# Patient Record
Sex: Female | Born: 1965 | Race: Black or African American | Hispanic: No | Smoking: Never smoker
Health system: Southern US, Community
[De-identification: ages and names within clinical notes are randomized; demographics above are authoritative.]

## PROBLEM LIST (undated history)

## (undated) DIAGNOSIS — C801 Malignant (primary) neoplasm, unspecified: Secondary | ICD-10-CM

## (undated) DIAGNOSIS — Z85528 Personal history of other malignant neoplasm of kidney: Secondary | ICD-10-CM

## (undated) DIAGNOSIS — R7303 Prediabetes: Secondary | ICD-10-CM

## (undated) DIAGNOSIS — I10 Essential (primary) hypertension: Secondary | ICD-10-CM

## (undated) HISTORY — PX: APPENDECTOMY: SHX54

---

## 2001-07-02 ENCOUNTER — Encounter: Admission: RE | Admit: 2001-07-02 | Discharge: 2001-07-02 | Payer: Self-pay

## 2002-05-06 ENCOUNTER — Encounter (INDEPENDENT_AMBULATORY_CARE_PROVIDER_SITE_OTHER): Payer: Self-pay

## 2002-05-06 ENCOUNTER — Encounter: Payer: Self-pay | Admitting: Emergency Medicine

## 2002-05-06 ENCOUNTER — Inpatient Hospital Stay (HOSPITAL_COMMUNITY): Admission: EM | Admit: 2002-05-06 | Discharge: 2002-05-11 | Payer: Self-pay | Admitting: Emergency Medicine

## 2002-07-04 ENCOUNTER — Ambulatory Visit (HOSPITAL_COMMUNITY): Admission: RE | Admit: 2002-07-04 | Discharge: 2002-07-04 | Payer: Self-pay | Admitting: General Surgery

## 2002-07-04 ENCOUNTER — Encounter: Payer: Self-pay | Admitting: General Surgery

## 2002-07-26 ENCOUNTER — Ambulatory Visit (HOSPITAL_COMMUNITY): Admission: RE | Admit: 2002-07-26 | Discharge: 2002-07-26 | Payer: Self-pay | Admitting: Urology

## 2002-07-26 ENCOUNTER — Encounter (INDEPENDENT_AMBULATORY_CARE_PROVIDER_SITE_OTHER): Payer: Self-pay | Admitting: Specialist

## 2002-07-26 ENCOUNTER — Encounter: Payer: Self-pay | Admitting: Urology

## 2002-08-22 HISTORY — PX: KIDNEY SURGERY: SHX687

## 2002-10-22 ENCOUNTER — Encounter: Admission: RE | Admit: 2002-10-22 | Discharge: 2002-10-22 | Payer: Self-pay | Admitting: Urology

## 2002-10-22 ENCOUNTER — Encounter: Payer: Self-pay | Admitting: Urology

## 2002-11-01 ENCOUNTER — Ambulatory Visit (HOSPITAL_BASED_OUTPATIENT_CLINIC_OR_DEPARTMENT_OTHER): Admission: RE | Admit: 2002-11-01 | Discharge: 2002-11-01 | Payer: Self-pay | Admitting: Urology

## 2002-11-19 ENCOUNTER — Encounter (INDEPENDENT_AMBULATORY_CARE_PROVIDER_SITE_OTHER): Payer: Self-pay | Admitting: Specialist

## 2002-11-19 ENCOUNTER — Ambulatory Visit (HOSPITAL_COMMUNITY): Admission: RE | Admit: 2002-11-19 | Discharge: 2002-11-19 | Payer: Self-pay | Admitting: Urology

## 2002-11-19 ENCOUNTER — Encounter: Payer: Self-pay | Admitting: Urology

## 2002-11-19 ENCOUNTER — Encounter (INDEPENDENT_AMBULATORY_CARE_PROVIDER_SITE_OTHER): Payer: Self-pay

## 2002-12-02 ENCOUNTER — Inpatient Hospital Stay (HOSPITAL_COMMUNITY): Admission: RE | Admit: 2002-12-02 | Discharge: 2002-12-04 | Payer: Self-pay | Admitting: Urology

## 2002-12-02 ENCOUNTER — Encounter (INDEPENDENT_AMBULATORY_CARE_PROVIDER_SITE_OTHER): Payer: Self-pay | Admitting: Specialist

## 2002-12-14 ENCOUNTER — Emergency Department (HOSPITAL_COMMUNITY): Admission: EM | Admit: 2002-12-14 | Discharge: 2002-12-14 | Payer: Self-pay | Admitting: Emergency Medicine

## 2003-01-25 ENCOUNTER — Emergency Department (HOSPITAL_COMMUNITY): Admission: EM | Admit: 2003-01-25 | Discharge: 2003-01-25 | Payer: Self-pay | Admitting: *Deleted

## 2003-06-27 ENCOUNTER — Other Ambulatory Visit: Admission: RE | Admit: 2003-06-27 | Discharge: 2003-06-27 | Payer: Self-pay | Admitting: Internal Medicine

## 2003-12-02 ENCOUNTER — Emergency Department (HOSPITAL_COMMUNITY): Admission: EM | Admit: 2003-12-02 | Discharge: 2003-12-02 | Payer: Self-pay | Admitting: Family Medicine

## 2004-08-09 ENCOUNTER — Observation Stay (HOSPITAL_COMMUNITY): Admission: RE | Admit: 2004-08-09 | Discharge: 2004-08-09 | Payer: Self-pay | Admitting: Gynecology

## 2004-08-09 ENCOUNTER — Encounter (INDEPENDENT_AMBULATORY_CARE_PROVIDER_SITE_OTHER): Payer: Self-pay | Admitting: Specialist

## 2004-08-09 ENCOUNTER — Encounter (INDEPENDENT_AMBULATORY_CARE_PROVIDER_SITE_OTHER): Payer: Self-pay | Admitting: *Deleted

## 2004-11-15 ENCOUNTER — Emergency Department (HOSPITAL_COMMUNITY): Admission: EM | Admit: 2004-11-15 | Discharge: 2004-11-15 | Payer: Self-pay | Admitting: Family Medicine

## 2004-11-28 ENCOUNTER — Emergency Department (HOSPITAL_COMMUNITY): Admission: EM | Admit: 2004-11-28 | Discharge: 2004-11-29 | Payer: Self-pay | Admitting: Emergency Medicine

## 2004-12-09 ENCOUNTER — Ambulatory Visit: Payer: Self-pay | Admitting: Internal Medicine

## 2005-02-11 ENCOUNTER — Ambulatory Visit: Payer: Self-pay | Admitting: Internal Medicine

## 2005-07-19 ENCOUNTER — Other Ambulatory Visit: Admission: RE | Admit: 2005-07-19 | Discharge: 2005-07-19 | Payer: Self-pay | Admitting: Obstetrics and Gynecology

## 2006-10-26 ENCOUNTER — Other Ambulatory Visit: Admission: RE | Admit: 2006-10-26 | Discharge: 2006-10-26 | Payer: Self-pay | Admitting: Obstetrics and Gynecology

## 2006-10-31 ENCOUNTER — Ambulatory Visit (HOSPITAL_COMMUNITY): Admission: RE | Admit: 2006-10-31 | Discharge: 2006-10-31 | Payer: Self-pay | Admitting: Obstetrics and Gynecology

## 2007-08-30 ENCOUNTER — Ambulatory Visit (HOSPITAL_COMMUNITY): Admission: RE | Admit: 2007-08-30 | Discharge: 2007-08-30 | Payer: Self-pay | Admitting: Urology

## 2007-09-24 ENCOUNTER — Ambulatory Visit: Payer: Self-pay | Admitting: Gastroenterology

## 2008-09-09 ENCOUNTER — Ambulatory Visit (HOSPITAL_COMMUNITY): Admission: RE | Admit: 2008-09-09 | Discharge: 2008-09-09 | Payer: Self-pay | Admitting: Urology

## 2009-09-18 ENCOUNTER — Ambulatory Visit (HOSPITAL_COMMUNITY): Admission: RE | Admit: 2009-09-18 | Discharge: 2009-09-18 | Payer: Self-pay | Admitting: Urology

## 2010-11-16 ENCOUNTER — Ambulatory Visit: Payer: Self-pay | Admitting: Family Medicine

## 2011-01-07 NOTE — Discharge Summary (Signed)
NAME:  Patricia Harris, Patricia Harris                           ACCOUNT NO.:  [id number]   MEDICAL RECORD NO.:  [id number]                   PATIENT TYPE:  INP   LOCATION:  0351                                 FACILITY:  [facility]   PHYSICIAN:  Mark C. Vernie Ammons, M.D.               DATE OF BIRTH:  10-15-1965   DATE OF ADMISSION:  12/02/2002  DATE OF DISCHARGE:  12/04/2002                                 DISCHARGE SUMMARY   PRINCIPAL DIAGNOSES:  Left renal cell carcinoma.   OTHER DIAGNOSES:  1. Postoperative anemia.  2. Gastroesophageal reflux disease.   MAJOR OPERATION:  Left radical nephrectomy.  The pathology revealed renal  cell carcinoma 4.5 cm in greatest dimension, grade 3/4, pathologic stage T1  B.   DISPOSITION:  The patient is discharged home in stable, satisfactory, and  improved condition tolerating regular diet, afebrile and her incision is  healing nicely.  She had skin staples in place which will be removed at her  first follow-up in my office in approximately one week.  Her activity will  be limited to no heavy lifting, no driving, minimal up and down stairs.  Her  diet will be unrestricted.   DISCHARGE MEDICATIONS:  1. Tylox one to two q.4h. p.r.n. pain.  2. Iron sulfate 65 mg one p.o. daily.   HISTORY:  The patient is a 45 year old black female who was found on work-up  for right lower quadrant pain to have a left renal mass.  It was  indeterminate in its appearance and was appropriately followed up with  repeat CT scan.  I also performed serial ultrasounds as well as retrograde  pyelogram.  The mass was aspirated, checked for infectious agents and it was  negative and fine needle aspiration cytology was negative for malignant  cells.  A repeat core biopsy, however, revealed clear cell carcinoma.  She  is admitted for left radical nephrectomy.   Past history and physical examination previously dictated, noted in her  hospital chart.   HOSPITAL COURSE:  On December 02, 2002 the  patient was taken to the operating  room where she underwent a left radical nephrectomy without complication or  need for transfusion.  The night of surgery she was noted to be doing well,  clear urine, good urine output and the following day the Foley catheter was  removed.  A postoperative hemoglobin and hematocrit were noted to be  slightly low at 9.1 and 26.4, respectively.  The following day it was  rechecked.  It was 9.5 and 27.4, noted to be stable.  No ecchymoses were  present.   Her renal function remained within the normal range increasing from 0.8 on  admission to 0.2 and then 0.3.  Her diet was advanced from clear liquids to  regular diet.  This was well tolerated.  The patient's incision was noted to  be  healing well without signs of infection  and by her second postoperative day  she was felt ready for discharge once she eats her morning meal.  I  discussed the pathology report with her and will see her in my office in  follow-up in one week for staple removal.                                               Mark C. Vernie Ammons, M.D.    MCO/MEDQ  D:  12/04/2002  T:  12/04/2002  Job:  [id number]   cc:   Adolph Pollack, M.D.  [address]  [place]  [place] [address]  Fax: [phone]

## 2011-01-07 NOTE — Op Note (Signed)
NAME:  Patricia Harris, Patricia Harris                           ACCOUNT NO.:  [id number]   MEDICAL RECORD NO.:  [id number]                   PATIENT TYPE:  AMB   LOCATION:  NESC                                 FACILITY:  [facility]   PHYSICIAN:  Mark C. Vernie Ammons, M.D.               DATE OF BIRTH:  19-Nov-1965   DATE OF PROCEDURE:  11/01/2002  DATE OF DISCHARGE:                                 OPERATIVE REPORT   PREOPERATIVE DIAGNOSES:  Left renal lesion.   POSTOPERATIVE DIAGNOSES:  Left renal lesion.   PROCEDURE:  1. Cystoscopy.  2. Left retrograde pyelography.   SURGEON:  Mark C. Vernie Ammons, M.D.   ASSISTANT:  Crecencio Mc, M.D.   ANESTHESIA:  General.   COMPLICATIONS:  None.   INDICATIONS FOR PROCEDURE:  Ms. Recendiz is an interesting 45 year old African-  American female who was recently evaluated after an abnormality was noted on  her left kidney during a workup for appendicitis. The patient had a CT scan  which demonstrated a 1.5 x 3.5 cm cystic and possibly solid component left  renal mass that did appear to displace the collecting system somewhat. A  renal ultrasound was also performed which demonstrated a lesion consistent  with the CT scan results with cystic and solid components. The patient  subsequently had an aspiration biopsy performed as it was felt that this  lesion had a low suspicion of being malignant initially. It possibly  represented an area of focal nephronia versus renal abscess. The aspiration  did not reveal any significant abnormality or purulence. Of note, the  patient had been having some pain in this area which lead to the thought  process that this may be infectious in nature. It was then decided to have  the patient followup with a CT scan three months later. This demonstrated an  area in the left mid pole of the kidney consistent with an area of abnormal  enhancement. While this distorted the renal contour very little, it did  appear to compress the collecting system of  the kidney. It was therefore  thought that this could represent a possible transitional cell carcinoma.  However, renal cell carcinoma could not be ruled out either. Therefore, it  was decided to perform cystoscopy with retrograde pyelography to identify  any abnormality of the collecting system. The potential risks and benefits  of this procedure were explained to the patient and she consented.   DESCRIPTION OF PROCEDURE:  The patient was taken to the operating room and a  general anesthetic was administered. The patient was given preoperative  antibiotics, placed in the dorsal lithotomy position, prepped and draped in  the usual sterile fashion. Next, cystoscopy was performed with the 12 and 70  degree lens. A complete survey of the bladder revealed no evidence of  bladder tumors, stones, or other mucosal pathology. The ureteral orifices  were in the normal anatomic position and effluxing  clear urine. Attention  then turned to the left ureteral orifice and a retrograde pyelogram was  performed with a 6 French endhole catheter. This revealed a normal left  ureter without evidence of filling defects or dilation. The left renal  pelvis also appeared normal without intracollecting system filling defects.  However, there did appear to be some splaying of the mid to lower pole  collecting system consistent with external compression due to mass effect.  As there did not appear to be any intracollecting system masses, it was  decided not to perform ureteroscopy. Therefore the patient's bladder was  emptied and the procedure was ended. There were no complications and the  patient appeared to tolerate the procedure well. She was able to be  transferred to the recovery unit in satisfactory condition. Please note that  Dr. Ihor Gully was the operating surgeon and was present and participated  in this entire procedure.     Crecencio Mc, M.D.                          Veverly Fells. Vernie Ammons, M.D.     LB/MEDQ  D:  11/01/2002  T:  11/01/2002  Job:  [id number]

## 2011-01-07 NOTE — Op Note (Signed)
NAME:  Patricia Harris, Patricia Harris                 ACCOUNT NO.:  [id number]   MEDICAL RECORD NO.:  [id number]          PATIENT TYPE:  OBV   LOCATION:  9399                          FACILITY:  [facility]   PHYSICIAN:  Juan H. Lily Peer, M.D.DATE OF BIRTH:  05-28-1966   DATE OF PROCEDURE:  DATE OF DISCHARGE:                                 OPERATIVE REPORT   Audio too short to transcribe (less than 5 seconds)     Juan   JHF/MEDQ  D:  08/09/2004  T:  08/09/2004  Job:  [id number]

## 2011-01-07 NOTE — H&P (Signed)
NAME:  Patricia Harris, Patricia Harris                           ACCOUNT NO.:  [id number]   MEDICAL RECORD NO.:  [id number]                   PATIENT TYPE:  EMS   LOCATION:  ED                                   FACILITY:  [facility]   PHYSICIAN:  Adolph Pollack, M.D.            DATE OF BIRTH:  [birth year]   DATE OF ADMISSION:  05/06/2002  DATE OF DISCHARGE:                                HISTORY & PHYSICAL   CHIEF COMPLAINT:  Right lower quadrant pain.   HISTORY OF PRESENT ILLNESS:  The patient is a 45 year old female who began  having some severe right upper quadrant pain, combination of severe cramping  and a sharp pain, along with nausea and vomiting.  She had also had some  vaginal discharge noted.  She reports subjective fever.  She had something  like this about a month ago but it was self-limited.  She has a known right  ovarian cyst and she thought the pain a month ago was that, thought this  pain was that as well.  Her last menstrual period was two weeks ago.  She  was evaluated in the emergency department and CT scan was performed.  This  demonstrated findings consistent with acute appendicitis.  Also noted was an  ill-defined 2.5 cm left renal mass.  It was at this time that I was asked to  see her.   PAST MEDICAL HISTORY:  1. Right ovarian cyst.  2. Gastroesophageal reflux disease.   PREVIOUS OPERATIONS:  None.   ALLERGIES:  No known drug allergies.   MEDICATIONS:  Omeprazole q.d.   SOCIAL HISTORY:  Denies tobacco or alcohol use.  She works for [employer], Geographical information systems officer.   FAMILY HISTORY:  Positive for hypertension in her father.  Her mother died  from colon cancer.   REVIEW OF SYSTEMS:  CARDIOVASCULAR:  She denies hypertension or heart  disease.  PULMONARY:  She denies pneumonia, asthma, tuberculosis.  GI:  She  denies peptic ulcer disease, diverticulitis, or hepatitis.  GU:  She states  she has had urinary tract infection in the distant past.  Again, last  menstrual  period was two weeks ago.  ENDOCRINE:  No diabetes or thyroid  disease.  HEMATOLOGIC:  No bleeding disorders, blood clots, or transfusions.   PHYSICAL EXAMINATION:  GENERAL:  A moderately-obese female, appears to be  ill.  VITAL SIGNS:  Temperature 98.4, blood pressure 107/65, heart rate 79,  respiratory rate 28.  HEENT:  Extraocular movements intact and no icterus is noted.  NECK:  Supple without palpable masses.  HEART:  Demonstrates increased rate with a regular rhythm.  RESPIRATORY:  Breath sounds are equal and clear and respirations are  nonlabored.  ABDOMEN:  Soft.  Hypoactive bowel sounds noted.  There is right lower  quadrant tenderness and guarding to palpation and percussion.  There is a  positive Rovsing's sign.  BACK:  No CVA  tenderness.  EXTREMITIES:  No edema noted.  Full range of motion is noted.  She had  normal station and gait as she walked to and from the bathroom.   LABORATORY DATA:  Hemoglobin 13.1; white blood cell count 24,300 with a  leftward shift.  Urinalysis demonstrates 3-6 white blood cells, rare  bacteria, and 0-2 red blood cells.   CT scan was reviewed and demonstrates findings as above.   IMPRESSION:  1. Acute appendicitis.  2. Ill-defined left renal mass.   PLAN:  Laparoscopic/possible open appendectomy.  Will need short-term follow-  up of the renal mass by way of CT.  I did explain the procedure and the  risks of the appendectomy to her.  The risks include but are not limited to  bleeding; infection; risks of anesthesia; risk of accidental damage to  intraabdominal organs such as the small intestine, bladder, large intestine,  etc.  She seems to understand this and is agreeable to proceeding.                                                 Adolph Pollack, M.D.    Kari Baars  D:  05/06/2002  T:  05/06/2002  Job:  [id number]

## 2011-01-07 NOTE — Consult Note (Signed)
   NAME:  Patricia Harris, Patricia Harris                           ACCOUNT NO.:  [id number]   MEDICAL RECORD NO.:  [id number]                   PATIENT TYPE:  EMS   LOCATION:  ED                                   FACILITY:  [facility]   PHYSICIAN:  Claudette Laws, M.D.               DATE OF BIRTH:  1965-10-01   DATE OF CONSULTATION:  01/25/2003  DATE OF DISCHARGE:                                   CONSULTATION   CHIEF COMPLAINT:  Abdominal pain.   PRESENT ILLNESS:  This 45 year old lady underwent a left nephrectomy by Dr.  Vernie Ammons on about April 12.  She has been doing well and then since Monday  has developed some pain in the medial portion of the incision, no fever or  chills, no nausea or vomiting. She called me earlier in the day and I talked  with her, and then she presented herself to the emergency room where I saw  her. Otherwise, no change in her review of systems.  No known drug  allergies.  She has had an appendectomy in the past.   PHYSICAL EXAMINATION:  In no acute distress.  Abdomen was somewhat obese but  nontender, no rebound. The incision is healing nicely. Some voluntary  guarding with the left upper quadrant.  Her BP was 144/102, pulse 64,  respirations 18, temp 97.4.   IMPRESSION:  1. Abdominal pain, unknown etiology.  Rule out  incisional pain.  2. Status post left radical nephrectomy December 02, 2002, by Dr. Vernie Ammons.   DISCUSSION:  I went over the findings with the patient and basically only  recommended symptomatic treatment for now. I did not feel that a CAT scan  was necessary at this point. I did write a prescription for Tylox #30 p.r.n.  pain and told her to take it easy this weekend and call me if she got any  worse, but she is to follow up with Dr. Vernie Ammons on Monday, January 27, 2003.                                               Claudette Laws, M.D.    RFS/MEDQ  D:  01/25/2003  T:  01/25/2003  Job:  [id number]   cc:   Veverly Fells. Vernie Ammons, M.D.  [address]  [place]  [place] [address]  Fax: [phone]

## 2011-01-07 NOTE — Discharge Summary (Signed)
   NAME:  Patricia Harris, Patricia Harris                           ACCOUNT NO.:  [id number]   MEDICAL RECORD NO.:  [id number]                   PATIENT TYPE:  INP   LOCATION:  0444                                 FACILITY:  [facility]   PHYSICIAN:  Adolph Pollack, M.D.            DATE OF BIRTH:  11-23-1965   DATE OF ADMISSION:  05/06/2002  DATE OF DISCHARGE:  05/11/2002                                 DISCHARGE SUMMARY   PRINCIPAL DISCHARGE DIAGNOSIS:  Perforated appendicitis.   SECONDARY DIAGNOSIS:  Gastroesophageal reflux disease.   PROCEDURE:  Laparoscopic appendectomy.   REASON FOR ADMISSION:  This is a 45 year old female with abdominal pain, who  came to the emergency room and was evaluated by the emergency room  physician. CT scan demonstrated findings consistent with acute appendicitis.  Also noted on the CT scan was an ill defined 2.5 cm central left renal mass.  I saw her and subsequently she was admitted and taken to the OR.   HOSPITAL COURSE:  She underwent the above procedure without complications.  She is noted to have a perforation. The abdominal cavity was copiously  irrigated intraoperatively. Postoperatively, she is kept on the IV  antibiotics. She had a problem with an IV one day and it was switched over  to Augmentin but she continued to run fevers and I changed her back over to  Cefotetan. She began having some bowel activity on the third and fourth  postoperative days. By her fifth postoperative day here, she was  defervescing. White blood cell count was declining. She felt much better  overall. Bowel function had returned. She was tolerating a diet and was able  to be discharged.   DISPOSITION:  Discharged to home in satisfactory condition on May 11, 2002.   DISCHARGE MEDICATIONS:  1. She is discharged on Ciprofloxacin 750 mg every twelve hours.  2. Flagyl 500 mg every four hours.  3. Vicodin as needed for pain.   FOLLOW UP:  She will come back and see me in two  weeks. She knows to call as  soon as she has problems.                                                  Adolph Pollack, M.D.    Kari Baars  D:  05/21/2002  T:  05/21/2002  Job:  [id number]

## 2011-01-07 NOTE — Op Note (Signed)
NAME:  Patricia Harris, Patricia Harris                           ACCOUNT NO.:  [id number]   MEDICAL RECORD NO.:  [id number]                   PATIENT TYPE:  INP   LOCATION:  X002                                 FACILITY:  [facility]   PHYSICIAN:  Mark C. Vernie Ammons, M.D.               DATE OF BIRTH:  1966/03/30   DATE OF PROCEDURE:  12/02/2002  DATE OF DISCHARGE:                                 OPERATIVE REPORT   PREOPERATIVE DIAGNOSES:  Left renal cell carcinoma.   POSTOPERATIVE DIAGNOSES:  Left renal cell carcinoma.   PROCEDURE:  Left radical nephrectomy.   SURGEON:  Mark C. Vernie Ammons, M.D.   ASSISTANT:  Lindaann Slough, M.D.   ANESTHESIA:  General endotracheal.   DRAINS:  16 French Foley catheter.   ESTIMATED BLOOD LOSS:  Approximately 300 mL.   SPECIMENS:  Left kidney to pathology.   COMPLICATIONS:  None.   INDICATIONS FOR PROCEDURE:  The patient is a 45 year old black female who  was incidentally found to have a left renal mass by CT scan and atypical  appearance in the serial scans were obtained and eventually her workup  revealed clear cell carcinoma of the left kidney by core biopsy. She was  brought to the OR today for a left radical nephrectomy.   DESCRIPTION OF PROCEDURE:  After informed consent, the patient was brought  to the major OR, placed on the table, administered general anesthesia. A  Foley catheter was inserted and she was moved to the flank position, secured  to the table and  the left flank was then sterilely prepped and draped. A  skin incision was then made subcostally from the tip of the twelfth rib,  carried down through the external and internal oblique fascia and muscles as  well as transversalis. I then entered the retroperitoneal space and  identified the ureter with a combination of blunt and sharp dissection. This  was clipped and divided. The gonadal vessels were also identified, clipped  and divided. I dissected Gerota's fascia off of the peritoneum medially  and  identified the renal vein and artery. I dissected posteriorly behind the  kidney and was able to lift the kidney up and expose a single renal artery  which was doubly tied with #0 silk suture proximally and singly tied  distally and cut and divided. With the arterial supply controlled, I then  isolated, tied, ligated and divided the adrenal vein as well as the gonadal  vein next to the renal vein. Interestingly two gonadal veins were  identified, both were ligated and divided. I then tied the renal vein with  two #1 silk sutures proximally, one distally and divided it. The remaining  attachments superiorly were isolated with right angled clamp, clipped and  divided. A portion of the adrenal gland was removed. The bulk of the adrenal  gland was left in situ. The tail of the pancreas was also identified and  dissected off of Gerota's fascia. No injury to the tail of the pancreas  occurred. I then irrigated the wound, checked for bleeding points. These  were cauterized and I then placed Gelfoam in the bed of the adrenal gland  the most superior portion of the renal fossa. No further bleeding was  identified and therefore, lowered the kidney rest and closed the wound in  three layers, first closing the transversalis and the internal oblique and  external oblique all with running #1 PDS suture. The wound was copiously  irrigated with saline and 0.5% plain Marcaine was then used to anesthetize  the skin and subcu region after which the skin was closed with skin staples  and a sterile occlusive dressing was applied. The patient was then awakened  and taken to the recovery room in stable satisfactory condition. She  tolerated the procedure well with no intraoperative complications. Sponge,  needle and instrument counts were reportedly correct x2 at the end of the  operation.                                                Mark C. Vernie Ammons, M.D.    MCO/MEDQ  D:  12/02/2002  T:  12/02/2002   Job:  [id number]

## 2011-01-07 NOTE — Op Note (Signed)
NAME:  Patricia Harris, Patricia Harris                           ACCOUNT NO.:  [id number]   MEDICAL RECORD NO.:  [id number]                   PATIENT TYPE:  INP   LOCATION:  0444                                 FACILITY:  [facility]   PHYSICIAN:  Adolph Pollack, M.D.            DATE OF BIRTH:  1966-06-02   DATE OF PROCEDURE:  05/06/2002  DATE OF DISCHARGE:                                 OPERATIVE REPORT   PREOPERATIVE DIAGNOSIS:  Acute appendicitis.   POSTOPERATIVE DIAGNOSIS:  Perforated appendicitis.   PROCEDURE:  Laparoscopic appendectomy.   SURGEON:  Adolph Pollack, M.D.   ANESTHESIA:  General.   INDICATIONS:  The patient is a 45 year old female who had persistent right  lower quadrant pain with nausea and vomiting.  She presented to the  emergency department where she was evaluated and noted to have a white blood  cell count with 24,300.  CT scan was consistent with appendicitis, and she  also had an ill-defined renal mass.  I was subsequently asked to see her,  and now she is brought to the operating room.  The procedure and risks were  explained to her preoperatively.   TECHNIQUE:  She is placed supine on the operating room table and a general  anesthetic was administered.  A Foley catheter was placed in the bladder.  Her abdomen was sterilely prepped and draped.  A small subumbilical incision  was made through the skin and subcutaneous tissue, down to the level of the  midline fascia.  A small incision was made in the midline fascia.  The  peritoneal cavity was entered bluntly and under direct vision.  A  pursestring suture of 0 Vicryl was placed around the fascial _____________.  An Hasson trocar was introduced through the peritoneal cavity, and  pneumoperitoneum created by insufflation of CO2 gas.   The laparoscope was introduced and I saw purulent fluid along the right  gutter in the pelvis, consistent with appendiceal perforation.  A 5 mm  trocar was then placed in the left  lower quadrant region and one also in the  right upper quadrant region.  The patient was placed in the Trendelenburg  position and rotated partially left-side down.  I evacuated the purulent  fluid.  I then identified the appendix, which was acutely inflamed at its  tip, with some fibrinous debris over the tip -- which may have well been the  point of perforation.  I divided the mesoappendix with the harmonic scalpel  down to the base of the appendix as it joined the cecum.  I then amputated  the appendix off the cecum with the Endo GIA stapler.  The staple line was  flush with the cecum and hemostatic.  The appendix was placed in an Endo  pouch bag and then removed through the subumbilical incision.  The Hasson  trocar was then placed back into the abdominal cavity.   I then  copiously irrigated out the abdominal cavity with 2 L of saline  solution.  I noted the visceral peritoneum and some of the small intestine  around the uterus, and on the right to be inflamed.  I did note a small  right ovarian cyst.   Once I evacuated as much of the fluid as possible, I removed the left lower  quadrant trocar and then removed the subumbilical trocar.  I closed the  fascial defect under direct laparoscopic vision by tightening up and tying  down the pursestring suture.  I then released the pneumoperitoneum by way of  the right upper quadrant trocar and then removed it.  The skin incisions  were closed with 4-0 Monocryl subcuticular stitches.  Steri-Strips and  sterile dressings were applied.   She tolerated the procedure well without any apparent complications.  She  was taken to the recovery room in satisfactory condition.  Because of the  perforation, she will require postoperative IV antibiotics and may well  develop significant postoperative ileus.  Unfortunately, the risks of  recurrent intraabdominal abscess following the procedure is a bit higher  too.                                                Adolph Pollack, M.D.    Kari Baars  D:  05/06/2002  T:  05/06/2002  Job:  [id number]

## 2011-01-07 NOTE — H&P (Signed)
NAME:  Patricia Harris, Patricia Harris                           ACCOUNT NO.:  [id number]   MEDICAL RECORD NO.:  [id number]                   PATIENT TYPE:  INP   LOCATION:  X002                                 FACILITY:  [facility]   PHYSICIAN:  Mark C. Vernie Ammons, M.D.               DATE OF BIRTH:  03/14/1966   DATE OF ADMISSION:  12/02/2002  DATE OF DISCHARGE:                                HISTORY & PHYSICAL   HISTORY OF PRESENT ILLNESS:  The patient is a 45 year old black female who I  initially saw in November of last year. She had had a recent CT scan for  abdominal pain and was found to have appendicitis, for which she underwent  laparoscopic appendectomy. The CT scan which was obtained as part of that  workup revealed an ill-defined, 2.5-cm left renal mass. She had no history  of pyelonephritis or other voiding symptoms.   A repeat CT scan was obtained to determine whether the area demonstrated  evidence of resolution, and it appeared somewhat more cystic on a followup  CT scan. It increased slightly in size. A renal ultrasound that day revealed  no significant increase in blood flow in this area, and it was my feeling  that this could possibly represent an abscess, although she was minimally  symptomatic. The question of whether it could be tuberculosis related or  some other more indolent process was entertained.   She was scheduled for aspiration of this, and at the time under  ultrasound  the area was very difficult to visualize by the radiologist. He felt it  looked like a focal nephronium but was able to aspirate some material. That  material was cultured, but no organisms grew. The cytology was negative.   I repeated her ultrasound in December and noted the area appeared to be less  prominent, measuring somewhat smaller and appeared to have less fluid or  cyst-like component and be fairly sharply demarcated. It was again  entertained infectious versus congenital or acquired lesion such as  a  calyceal diverticulum. My feeling was that malignancy was possible but low  probability.   I followed it up with a repeat CT scan and then it appeared slightly more  solid. Because of that change, she underwent repeat core biopsy of the  lesion. 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 There was no adenopathy. Her chest x-ray was clear. She is  admitted at this time for elective left radical nephrectomy.   PAST MEDICAL HISTORY:  1. Right ovarian cyst in January 2002. 2  2. Gastroesophageal reflux disease.   PAST SURGICAL HISTORY:  Laparoscopic appendectomy.   ALLERGIES:  No known drug allergies.   SOCIAL HISTORY:  She denies tobacco or ethanol use. She works for [employer].   FAMILY HISTORY:  Positive for hypertension in her father; he is 52.  Her  mother died of  colon cancer at 53. There is no diabetes or kidney stones in  the family.   REVIEW OF SYSTEMS:  Negative for any cardiac or pulmonary complaint. She  does have some occasional reflux symptoms. No urologic complaints are noted.  No bleeding disorders.   PHYSICAL EXAMINATION:  VITAL SIGNS:  Stable per health history.  GENERAL:  She is a slightly obese black female in no apparent distress.  HEENT:  Normocephalic, atraumatic. Oropharynx clear.  NECK:  Supple without mass, JVD or adenopathy.  CARDIOVASCULAR:  Regular rate and rhythm.  CHEST:  Clear to auscultation with normal respiratory effort.  ABDOMEN:  Soft, nontender without masses or hepatosplenomegaly. No CVA  tenderness. No peritoneal signs are noted.  GU:  Normal external female genitalia with a normally placed urethral  meatus. No periurethral lesions, mass or tenderness. There is no bladder  base abnormality. Cervix is palpably normal. No vaginal  discharge or  urethral discharge.  EXTREMITIES:  No cyanosis, clubbing or edema. Full range of motion.  NEUROLOGIC:  No gross focal neurologic  deficits.   IMPRESSION:  Left renal cell carcinoma. The lesion is small but appears to  be contained within the kidney. There does not appear to be any adenopathy  and no abnormality on chest x-ray or other signs or symptoms that would  suggest metastatic disease. We therefore have discussed left radical  nephrectomy as the best treatment option. Because of the location of the  lesion, there is no possibility of performing a partial nephrectomy. She has  undergone cystoscopy and retrograde pyelogram on the left hand side and the  collecting system appears entirely normal with no evidence of infiltration.  She therefore is admitted for elective left radical nephrectomy.   I have discussed the risks, complications and alternatives with her  including but not limited to the bleeding, infection, anesthetic risks, DVT,  PE, MI, pneumothorax, injury to surrounding organs such as the spleen  requiring their repair  and/or removal. The patient understands this and has  elected to proceed with surgical therapy.   PLAN:  1. Left radical nephrectomy.  2. Perioperative antibiotics.  3. Routine DVT prophylaxis with PAS and TED hose.                                               Mark C. Vernie Ammons, M.D.    MCO/MEDQ  D:  12/02/2002  T:  12/02/2002  Job:  [id number]

## 2011-01-07 NOTE — Op Note (Signed)
NAME:  Patricia Harris, Patricia Harris                 ACCOUNT NO.:  [id number]   MEDICAL RECORD NO.:  [id number]          PATIENT TYPE:  OBV   LOCATION:  9399                          FACILITY:  [facility]   PHYSICIAN:  Juan H. Lily Peer, M.D.DATE OF BIRTH:  [birth year]   DATE OF PROCEDURE:  08/09/2004  DATE OF DISCHARGE:                                 OPERATIVE REPORT   PREOPERATIVE DIAGNOSES:  1.  Left adnexal mass.  2.  Chronic pelvic pain.  3.  Request for elective permanent sterilization.   POSTOPERATIVE DIAGNOSES:  1.  Left adnexal mass.  2.  Chronic pelvic pain.  3.  Request for elective permanent sterilization.   ANESTHESIA:  General endotracheal anesthesia.   SURGEONS:  Juan H. Lily Peer, M.D., and Nadyne Coombes. Fontaine, M.D.   PROCEDURES PERFORMED:  1.  Laparoscopic left salpingo-oophorectomy.  2.  Right tubal sterilization procedure consisting of cauterization and      transection of the right fallopian tube.   INDICATION FOR OPERATION:  A 45 year old gravida 3, para 2, AB 1, with  chronic left lower quadrant pain, multicystic left adnexal mass, and also  requesting elective permanent sterilization.   FINDINGS:  Normal, smooth-appearing uterus.  Normal right tube with some  peritubal adhesions.  Right normal ovary.  Left normal tube.  Left ovary  approximately 4 cm in size, smooth surface with no excrescences or any  evidence of adhesions.  No endometriotic implants or on the anterior cul-de-  sac or posterior cul-de-sac.   DESCRIPTION OF OPERATION:  After the patient was adequately counseled, she  was taken to the operating room, where she underwent a successful general  endotracheal anesthesia.  She had PSA stockings for DVT prophylaxis and  received 1 g of Ancef for prophylaxis as well.  The abdomen was prepped and  draped in the usual sterile fashion.  Bimanual examination demonstrated a  slightly anteverted uterus and no palpable adnexal masses.  The Hulka  tenaculum was placed for  manipulation during laparoscopic procedure and a  Foley catheter was placed in an effort to monitor urinary output and keep  the bladder decompressed.  After the abdomen was prepped and draped in the  usual sterile fashion, a small stab incision was made underneath the  umbilicus, followed by insertion of a Veress needle.  Opening intra-  abdominal pressure was 9 mmHg.  Approximately 2 L of carbon dioxide was  insufflated to the peritoneal cavity. The Veress needle was removed.  The 10  mm trocar was inserted into the peritoneal cavity.  Two additional ports  were made approximately four fingerbreadths from the midline.  On the right  in the patient's right paramedian region, a 5 mm trocar was inserted and on  the left paramedian side, a 10 mm trocar was inserted, both under  laparoscopic guidance.  After the systematic evaluation of the pelvic cavity  with the above-mentioned findings, attention was placed to the right  fallopian tube.  Some of the peritubular adhesions were transected.  The  right fallopian tube was placed under traction with the self-retaining  retractor and a 2  cm segment of fallopian tube was cauterized and then  transected for the sterilization part of the procedure.  Attention was then  placed to the left adnexal region.  The ovary was 4 cm in size and appeared  to be multiloculated, with smooth surfaces without any excrescences.  It was  mobile.  The left ureter was identified.  The ovary and tube were placed  under traction and the left infundibulopelvic ligament was cauterized and  transected with the tripolar cauterizing cutting unit.  This was undertaken  all the way to the level of the utero-ovarian ligament and both tube and  ovary were completely excised.  The cyst was aspirated to help decompress  the cyst.  The fluid was sent for cytological evaluation and an Endopouch  was inserted through the left paramedian sleeve and the left tube and ovary  were  retrieved through the laparoscopic pouch and submitted for histologic  evaluation.  Pictures were obtained before and after the procedure.  The  pneumoperitoneum was evacuated and the incisions were closed in the  following fashion:  At the left paramedian 10 mm trocar site, the fascia was  closed with a running locking stitch of 0 Vicryl suture; the skin was  reapproximated with a running subcuticular stitch of 4-0 plain catgut  sutures and Steri-Strips were placed over the incision.  The 5 mm trocar  site was closed with two interrupted sutures of 4-0 plain catgut suture.  The subumbilical incision, due to the patient's panniculus, the surgeon's  finger palpated a very, very small fascial defect, which is not  reapproximated because under tension completely closed.  A subcuticular  stitch of 4-0 plain catgut suture was utilized and steri-stripped as well.  For postoperative analgesia, 0.25% Marcaine for a total of 9 mL was  infiltrated in all three port sites.  The Hulka tenaculum was removed.  The  Foley catheter was removed.  The patient was awakened and transferred to the  recovery room with stable vital signs.  Blood loss was minimal.  IV fluids  1900 mL of lactated Ringer's, urine output of 100 mL and clear.     Juan   JHF/MEDQ  D:  08/09/2004  T:  08/10/2004  Job:  [id number]

## 2011-01-07 NOTE — H&P (Signed)
NAME:  Patricia Harris, Patricia Harris                 ACCOUNT NO.:  [id number]   MEDICAL RECORD NO.:  [id number]          PATIENT TYPE:  OBV   LOCATION:  NA                            FACILITY:  [facility]   PHYSICIAN:  Juan H. Lily Peer, M.D.DATE OF BIRTH:  Sep 12, 1965   DATE OF ADMISSION:  DATE OF DISCHARGE:                                HISTORY & PHYSICAL   CHIEF COMPLAINT:  1.  Left lower quadrant pain.  2.  Left pelvic mass.  3.  Request for elective permanent sterilization.   HISTORY OF PRESENT ILLNESS:  The patient is a 45 year old, gravida 3, para  2, AB 1, who was seen in the office as a new patient on June 23, 2004  with a complaint of ongoing left lower quadrant pain.  The patient had been  seeing another gynecologist in the community and came to our office today  because of the ongoing worsening of left lower quadrant pain.  She has also  been under the care of Dr. Loraine Leriche C. Ottelin urologist here in [place] who  back in 2004 had done a left nephrectomy as a result of a renal carcinoma.  She has also had an appendectomy in 2003.  The patient stated her last  mammogram was at baseline at the age of 43 which was normal.  She is using  Ortho-Evra transdermal patch for contraception.  Her cycles have been  reported to be regular otherwise.  Her last Pap smear was normal in  September 2005 along with complete gynecological examination by another  practitioner which was reported to be normal.   The patient had stated that in the past her pain in left lower quadrant have  been slight before and after her periods but now it is all the time.  She is  not sexually active.  She has had three pregnancies-two children delivered  vaginally and one miscarriage.   On examination, there was a tenderness and fullness noted on the left  adnexal region.  I had spoken to Dr. Loraine Leriche C. Ottelin on the phone and he did  not see any contraindication to proceeding with a laparoscopic approach  since a nephrectomy  had been in a retroperitoneal procedure.  He had done an  ultrasound on her in the office when she saw him on June 17, 2004 because  of lower abdominal pain.  Since he had done the nephrectomy, she thought it  might have been urologically related.  The transvaginal ultrasound  demonstrated two nabothian cysts, small in size.  The uterus measures 7.8 by  4.8 by 5.5 cm.  Endometrial thickness was 6.3 mm.  The right ovary had a  small amount of fluid adjacent to it and the ovary measured 3 by 2.1 by 2.4  cm.  The left ovary measures 6 by 4.7 by 5.6 cm and was associated with two  ovarian cyst measuring 4.9 by 4.6 by 5.1 cm and the second measured 2.5 by  1.7 by 3.8 cm both appeared to be simple cysts.  The patient also would like  to proceed with a laparoscopic approach to  remove the left tube and ovary  and sterilization of the contralateral remaining tube since she does not  want to have any more children.   ALLERGIES:  The patient denies any allergies.   FAMILY HISTORY:  Grandmother with diabetes.  Mother with colon cancer.  The  patient's grandmother with breast cancer.  She has had a left nephrectomy in  2004 secondary to renal carcinoma.  She has also had an appendectomy in the  past.  She has been on Ortho-Evra transdermal patch.   PHYSICAL EXAMINATION:  VITAL SIGNS:  The patient weighs 221 pounds.  She is  5 feet 3-3/4 inches tall.  Blood pressure 108/60.  HEENT:  Unremarkable.  NECK:  Supple.  Trachea midline.  No carotid bruits.  No thyromegaly.  LUNGS:  Clear to auscultation without rhonchi or wheezes.  HEART:  Regular rate and rhythm.  No murmurs or gallops.  BREASTS:  Not done.  ABDOMEN:  Soft and nontender.  There is no rebound or guarding.  PELVIC:  There is fullness and tenderness in the left lower quadrant.  Uterus anteverted.  Normal size, shape, and consistency.  Right adnexa.  No  masses or tenderness.  RECTAL:  Deferred.   ASSESSMENT:  A 45 year old, gravida 3,  para 2, abortion 1, with chronic left  lower quadrant pain.  Recent ultrasound demonstrating multicystic left  ovary, two cyst-one measuring 4.9 by 4.6 by 5.1 cm and the second one  measuring 2.5 by 1.7 by 3.8 cm.  Both appear to be simple cysts.  The  patient with prior history of renal carcinoma resulting in left nephrectomy  back in 2004.  [name] and I had a lengthy discussion once again and a  preoperative consult that the appearance of this cyst did not appear to be  malignant but they could not be ruled out until we had a histologic  evaluation.  Because of her worsening left lower quadrant pain, we will  proceed with a diagnostic laparoscopy, left salpingo-oophorectomy.  She  would like to have her contralateral tube transected for sterilization so  that she will not have to continue on the Ortho-Evra transdermal patch since  childbearing is no longer an issue.  We will do pelvic washings and  depending on the histology at that point when we are through we will decide  on definitive course of action in the event that there is any evidence of  malignancy.  We discussed potential risks from laparoscopic surgery to  include trauma to internal organs requiring open laparotomy, also risk for  infection, although she will receive prophylaxis antibiotics.  In the event  of hemorrhage and she were to need blood transfusion, the risks of  transfusion were discussed to include anaphylactic reaction, hepatitis and  acquired immunodeficiency syndrome.  We also discussed the potential for  deep vein thrombosis and pulmonary embolus and she will have PSA stockings  as well as receive prophylaxis antibiotics.  In the event that technical  difficulty are encountered and the operation not able to be completed  laparoscopically, we will need to proceed with an open laparotomy to  complete the operation from that approach.  She was provided with literature information from the [employer] of  Obstetrics and Gynecology on  laparoscopic surgery.  All questions were answered and we will follow  accordingly.   PLAN:  The patient is scheduled for laparoscopic left salpingo-oophorectomy  and right tubal sterilization procedure on Monday, August 09, 2004 at 1  p.m. at  [facility].  Please have history and physical available.     Juan   JHF/MEDQ  D:  08/08/2004  T:  08/08/2004  Job:  [id number]

## 2011-01-07 NOTE — Op Note (Signed)
NAME:  Patricia Harris, Patricia Harris                 ACCOUNT NO.:  [id number]   MEDICAL RECORD NO.:  [id number]          PATIENT TYPE:  OBV   LOCATION:  9399                          FACILITY:  [facility]   PHYSICIAN:  Juan H. Lily Peer, M.D.DATE OF BIRTH:  30-Dec-1965   DATE OF PROCEDURE:  DATE OF DISCHARGE:                                 OPERATIVE REPORT   Audio too short to transcribe (less than 5 seconds)     Juan   JHF/MEDQ  D:  08/09/2004  T:  08/09/2004  Job:  [id number]

## 2012-09-19 ENCOUNTER — Emergency Department (HOSPITAL_COMMUNITY): Payer: BC Managed Care – PPO

## 2012-09-19 ENCOUNTER — Observation Stay (HOSPITAL_COMMUNITY)
Admission: EM | Admit: 2012-09-19 | Discharge: 2012-09-19 | Disposition: A | Discharge status: Home / Self Care | Payer: BC Managed Care – PPO | Attending: Emergency Medicine | Admitting: Emergency Medicine

## 2012-09-19 ENCOUNTER — Encounter (HOSPITAL_COMMUNITY): Payer: Self-pay

## 2012-09-19 DIAGNOSIS — R072 Precordial pain: Secondary | ICD-10-CM

## 2012-09-19 DIAGNOSIS — R0989 Other specified symptoms and signs involving the circulatory and respiratory systems: Secondary | ICD-10-CM | POA: Insufficient documentation

## 2012-09-19 DIAGNOSIS — R079 Chest pain, unspecified: Principal | ICD-10-CM | POA: Insufficient documentation

## 2012-09-19 DIAGNOSIS — R0609 Other forms of dyspnea: Secondary | ICD-10-CM | POA: Insufficient documentation

## 2012-09-19 DIAGNOSIS — Z85528 Personal history of other malignant neoplasm of kidney: Secondary | ICD-10-CM | POA: Insufficient documentation

## 2012-09-19 HISTORY — DX: Personal history of other malignant neoplasm of kidney: Z85.528

## 2012-09-19 LAB — POCT I-STAT TROPONIN I
Troponin i, poc: 0 ng/mL (ref 0.00–0.08)
Troponin i, poc: 0 ng/mL (ref 0.00–0.08)

## 2012-09-19 LAB — HEPATIC FUNCTION PANEL
AST: 10 U/L (ref 0–37)
Albumin: 3.2 g/dL — ABNORMAL LOW (ref 3.5–5.2)
Bilirubin, Direct: <0.1 mg/dL (ref 0.0–0.3)
Total Protein: 7.4 g/dL (ref 6.0–8.3)

## 2012-09-19 LAB — LIPASE, BLOOD: Lipase: 29 U/L (ref 11–59)

## 2012-09-19 LAB — BASIC METABOLIC PANEL
BUN: 16 mg/dL (ref 6–23)
CO2: 23 mEq/L (ref 19–32)
Chloride: 102 mEq/L (ref 96–112)
Creatinine, Ser: 0.8 mg/dL (ref 0.50–1.10)
Glucose, Bld: 109 mg/dL — ABNORMAL HIGH (ref 70–99)

## 2012-09-19 LAB — CBC
HCT: 33.7 % — ABNORMAL LOW (ref 36.0–46.0)
Hemoglobin: 11.4 g/dL — ABNORMAL LOW (ref 12.0–15.0)
MCV: 91.1 fL (ref 78.0–100.0)
RBC: 3.7 MIL/uL — ABNORMAL LOW (ref 3.87–5.11)
WBC: 14.5 10*3/uL — ABNORMAL HIGH (ref 4.0–10.5)

## 2012-09-19 MED ORDER — NITROGLYCERIN 0.4 MG SL SUBL
0.4000 mg | SUBLINGUAL_TABLET | SUBLINGUAL | Status: DC | PRN
  Administered 2012-09-19: 0.4 mg via SUBLINGUAL
  Filled 2012-09-19: qty 25

## 2012-09-19 MED ORDER — ONDANSETRON HCL 4 MG/2ML IJ SOLN
4.0000 mg | Freq: Once | INTRAMUSCULAR | Status: AC
  Administered 2012-09-19: 4 mg via INTRAVENOUS
  Filled 2012-09-19: qty 2

## 2012-09-19 MED ORDER — MORPHINE SULFATE 4 MG/ML IJ SOLN
4.0000 mg | Freq: Once | INTRAMUSCULAR | Status: AC
  Administered 2012-09-19: 4 mg via INTRAVENOUS
  Filled 2012-09-19: qty 1

## 2012-09-19 MED ORDER — SODIUM CHLORIDE 0.9 % IV SOLN: 1000.0000 mL | INTRAVENOUS | Status: DC

## 2012-09-19 MED ORDER — ASPIRIN 81 MG PO CHEW
324.0000 mg | CHEWABLE_TABLET | Freq: Once | ORAL | Status: AC
  Administered 2012-09-19: 324 mg via ORAL
  Filled 2012-09-19: qty 4

## 2012-09-19 NOTE — ED Provider Notes (Signed)
History     CSN: [id number]  Arrival date & time 09/19/12  0100   None     Chief Complaint  Patient presents with  . Chest Pain    (Consider location/radiation/quality/duration/timing/severity/associated sxs/prior treatment) HPI Comments: Patricia Harris is a 47 y.o. female w a hx of appendicitis and kidney CA presents to the ER c/o chest pain. CP started at 3 pm this afternoon and was mild lasting minutes. Pt states that she wrote it off as indigestions, taking Rolaids without relief. But as the day continued CP worsened becoming more af a "pressure like sensation" lasting longer. Pain is worsened by exertion and associated with DOE. Pain is substernal and does not radiate. Current pain is 6/10 in severity & has been constant since 10 pm.  Pt denies positional changes, PND, orthopnea, recent illness, nausea, leg swelling, cough, hemoptysis, fevers, NS or chills. She is a non-smoker and has no fam hx of cardiac issues.   Patient is a 47 y.o. female presenting with chest pain. The history is provided by the patient.  Chest Pain Primary symptoms include shortness of breath (more DOE). Pertinent negatives for primary symptoms include no fever, no fatigue, no cough, no wheezing, no palpitations, no abdominal pain, no nausea, no vomiting and no dizziness.  Pertinent negatives for associated symptoms include no diaphoresis.     Past Medical History  Diagnosis Date  . History of kidney cancer     LEFT    Past Surgical History  Procedure Date  . Appendectomy   . Kidney surgery 2004    LEFT KIDNEY REMOVED     No family history on file.  History  Substance Use Topics  . Smoking status: Never Smoker   . Smokeless tobacco: Never Used  . Alcohol Use: No    OB History    Grav Para Term Preterm Abortions TAB SAB Ect Mult Living                  Review of Systems  Constitutional: Negative for fever, chills, diaphoresis, activity change, fatigue and unexpected weight change.  HENT:  Negative for congestion, neck pain and neck stiffness.   Eyes: Negative for visual disturbance.  Respiratory: Positive for chest tightness and shortness of breath (more DOE). Negative for apnea, cough, wheezing and stridor.   Cardiovascular: Positive for chest pain. Negative for palpitations and leg swelling.  Gastrointestinal: Negative for nausea, vomiting, abdominal pain, diarrhea and blood in stool.  Genitourinary: Negative for dysuria, urgency, hematuria and flank pain.  Musculoskeletal: Negative for myalgias, back pain and gait problem.  Skin: Negative for color change, pallor and wound.  Neurological: Negative for dizziness, syncope, light-headedness and headaches.  All other systems reviewed and are negative.    Allergies  Review of patient's allergies indicates no known allergies.  Home Medications  No current outpatient prescriptions on file.  BP 136/88  Temp 97.9 F (36.6 C) (Oral)  Resp 24  SpO2 99%  LMP 08/22/2012  Physical Exam  Nursing note and vitals reviewed. Constitutional: She appears well-developed and well-nourished. No distress.  HENT:  Head: Normocephalic and atraumatic.  Eyes: Conjunctivae normal and EOM are normal. Pupils are equal, round, and reactive to light.  Neck: Normal range of motion. Neck supple. Normal carotid pulses and no JVD present. Carotid bruit is not present. No rigidity. Normal range of motion present.  Cardiovascular: Normal rate, regular rhythm, S1 normal, S2 normal, normal heart sounds, intact distal pulses and normal pulses.  Exam reveals no  gallop and no friction rub.   No murmur heard.      No pitting edema bilaterally, RRR, no aberrant sounds on auscultations, distal pulses intact, no carotid bruit or JVD.   Pulmonary/Chest: Effort normal. No accessory muscle usage or stridor. No respiratory distress. She exhibits no bony tenderness.       Chest wall ttp sternally, LCAB  Abdominal: Bowel sounds are normal.       Obese abdomen.  Soft, mild epigastric ttp. Non pulsatile aorta.   Skin: Skin is warm, dry and intact. No rash noted. She is not diaphoretic. No cyanosis. Nails show no clubbing.    ED Course  Procedures (including critical care time)  Labs Reviewed  CBC - Abnormal; Notable for the following:    WBC 14.5 (*)     RBC 3.70 (*)     Hemoglobin 11.4 (*)     HCT 33.7 (*)     All other components within normal limits  BASIC METABOLIC PANEL - Abnormal; Notable for the following:    Glucose, Bld 109 (*)     GFR calc non Af Amer 88 (*)     All other components within normal limits  HEPATIC FUNCTION PANEL - Abnormal; Notable for the following:    Albumin 3.2 (*)     All other components within normal limits  LIPASE, BLOOD   US Abdomen Complete  09/19/2012  *RADIOLOGY REPORT*  Clinical Data:  Epigastric pain  COMPLETE ABDOMINAL ULTRASOUND  Comparison:  09/18/2009 CT  Findings:  Gallbladder:  No gallstones, gallbladder wall thickening, or pericholecystic fluid.  Common bile duct:  Within normal limits, measuring 4.5 mm in diameter.  Liver:  Enlarged and increased in echogenicity.  Focal lesion detection is limited in the setting.  IVC:  Appears normal.  Pancreas:  No focal abnormality identified within the head/neck. Poorly visualized body and tail due to limited acoustic windows/bowel gas artifact.  Spleen:  Within normal limits, measuring 7.2 cm.  Right Kidney:  Prominent at 14.2 cm, likely compensatory enlargement.  No hydronephrosis or focal abnormality.  Left Kidney:  Absent.  Abdominal aorta:  No aneurysm identified.  The distal aorta is obscured.  IMPRESSION: Heterogeneous/increased hepatic echogenicity can be seen in the setting of steatosis or hepatitis.  Correlate with LFTs.  Status post left nephrectomy.  Limited visualization of the pancreas.   Original Report Authenticated By: Jearld Lesch, M.D.    Dg Chest Port 1 View  09/19/2012  *RADIOLOGY REPORT*  Clinical Data: Chest pain  PORTABLE CHEST - 1 VIEW   Comparison: 09/18/2009  Findings: Lungs are clear. No pleural effusion or pneumothorax. The cardiomediastinal contours are within normal limits. The visualized bones and soft tissues are without significant appreciable abnormality. Left upper quadrant surgical clip.  IMPRESSION: No acute cardiopulmonary process.   Original Report Authenticated By: Jearld Lesch, M.D.     Date: 09/19/2012  Rate: 70  Rhythm: normal sinus rhythm  QRS Axis: normal  Intervals: normal  ST/T Wave abnormalities: nonspecific T wave changes  Conduction Disutrbances:none  Narrative Interpretation:   Old EKG Reviewed: none available    No diagnosis found.    MDM  Chest pain  Patient is a 46 year old female with a history of kidney cancer and appendectomy status post appendicitis that presents emergency department complaining of chest pain.  History is concerning for cardiac etiology.  As patient does have reproducible chest wall tenderness and epigastric pain ultrasound was ordered to rule out possible biliary etiology.  No abnormal  findings on CT.  Patient's chest pain managed and treated within the emergency department and on reevaluation she is chest pain-free.  It is felt the patient would benefit from chest pain protocol. D/t pts BMI of 42 she will have cardiac stress test pending in the morning as she fails criteria for Coronary CT. CXR no acute abnormalities, trop normal x 1 (2nd pending), ECG w borderline T wave abnormalities. Pt has no personal or familial hx of heart disease, is a non smoker and denies having HLD or HTN. Pt seen w Dr. Norlene Campbell who agrees with my plan to place pt on CPP.         Jaci Carrel, [place] 09/19/12 [phone]

## 2012-09-19 NOTE — ED Notes (Signed)
Pt returned from US

## 2012-09-19 NOTE — ED Notes (Signed)
Pt ambulated to bathroom 

## 2012-09-19 NOTE — ED Notes (Signed)
Patient transported to X-ray 

## 2012-09-19 NOTE — ED Notes (Signed)
Pt returned from XR, patient placed back on the monitor

## 2012-09-19 NOTE — ED Provider Notes (Signed)
KATHALEEN DUDZIAK is a 47 y.o. female being evaluated for chest pain. The chest pain is resolved. She feels like the narcotic analgesic helped. She has had intermittent heartburn symptoms in the past. Her cholesterol was borderline elevated by her report; at a screening, December 2013.  Repeat vital signs are normal. Patient calm, comfortable.  Assessment: Noncardiac chest pain, possibly related to esophageal reflux. For further multiple other potential causes. Patient stable for discharge with outpatient management. She's not currently have a PCP, but does have access to one through her job.   Plan: Oral, Pepcid twice a day x3 weeks. Tylenol for pain. Followup with PCP of choice in 2 or 3 weeks.  Flint Melter, MD 09/19/12 [phone]

## 2012-09-19 NOTE — Progress Notes (Signed)
  Echocardiogram Echocardiogram Stress Test has been performed.  Patricia Harris 09/19/2012, 9:37 AM

## 2012-09-19 NOTE — ED Notes (Signed)
Report called for move to POD C to [facility] bed is ready bed

## 2012-09-19 NOTE — Progress Notes (Signed)
Utilization review completed.  P.J. Yovanna Cogan,RN,BSN Case Manager [phone]  

## 2012-09-19 NOTE — ED Provider Notes (Signed)
Medical screening examination/treatment/procedure(s) were conducted as a shared visit with non-physician practitioner(s) and myself.  I personally evaluated the patient during the encounter.  Pt here with CP, no prior h/o same, no significant risk factors, to be placed on chest pain protocol  Olivia Mackie, MD 09/19/12 [phone]

## 2012-09-19 NOTE — ED Notes (Signed)
Patient presents with c/o mid-CP; onset 3p. Described as a "pressure" rates 8/10 at its worst. Initially thought it was indigestion. Tried Rolaids with no relief. Pain was intermittent; now has gradually became more constant and frequent since 10 pm. Denies SOB, N/V, abd pain, neck pain, arm pain or dizziness. Patient endorses some lower back pain and a mild headache. Patient has no significant cardiac hx

## 2012-12-05 ENCOUNTER — Ambulatory Visit: Payer: Self-pay | Admitting: Family Medicine

## 2013-02-01 ENCOUNTER — Ambulatory Visit: Payer: Self-pay | Admitting: Specialist

## 2013-02-01 LAB — CBC WITH DIFFERENTIAL/PLATELET
Basophil %: 0.8 %
Eosinophil #: 0.3 10*3/uL (ref 0.0–0.7)
HCT: 33.6 % — ABNORMAL LOW (ref 35.0–47.0)
HGB: 11.4 g/dL — ABNORMAL LOW (ref 12.0–16.0)
Lymphocyte #: 2 10*3/uL (ref 1.0–3.6)
Lymphocyte %: 16.6 %
MCH: 30.4 pg (ref 26.0–34.0)
MCHC: 33.9 g/dL (ref 32.0–36.0)
Monocyte #: 0.7 x10 3/mm (ref 0.2–0.9)
Neutrophil #: 9.1 10*3/uL — ABNORMAL HIGH (ref 1.4–6.5)
Platelet: 406 10*3/uL (ref 150–440)
RDW: 13 % (ref 11.5–14.5)
WBC: 12.2 10*3/uL — ABNORMAL HIGH (ref 3.6–11.0)

## 2013-02-01 LAB — COMPREHENSIVE METABOLIC PANEL
Alkaline Phosphatase: 101 U/L (ref 50–136)
Anion Gap: 6 — ABNORMAL LOW (ref 7–16)
Chloride: 104 mmol/L (ref 98–107)
EGFR (African American): >60
EGFR (Non-African Amer.): >60
Osmolality: 274 (ref 275–301)
Potassium: 3.9 mmol/L (ref 3.5–5.1)
SGOT(AST): 9 U/L — ABNORMAL LOW (ref 15–37)
Sodium: 137 mmol/L (ref 136–145)

## 2013-02-01 LAB — PROTIME-INR: Prothrombin Time: 13 secs (ref 11.5–14.7)

## 2013-02-01 LAB — FOLATE: Folic Acid: 12.9 ng/mL (ref 3.1–100.0)

## 2013-02-01 LAB — TSH: Thyroid Stimulating Horm: 1.48 u[IU]/mL

## 2013-02-01 LAB — IRON AND TIBC: Iron Bind.Cap.(Total): 372 ug/dL (ref 250–450)

## 2013-02-01 LAB — HEMOGLOBIN A1C: Hemoglobin A1C: 5 % (ref 4.2–6.3)

## 2013-02-04 LAB — APTT: Activated PTT: 29.9 secs (ref 23.6–35.9)

## 2013-02-19 ENCOUNTER — Ambulatory Visit: Payer: Self-pay | Admitting: Specialist

## 2013-03-22 ENCOUNTER — Ambulatory Visit: Payer: Self-pay | Admitting: Specialist

## 2013-11-12 ENCOUNTER — Ambulatory Visit: Payer: Self-pay | Admitting: Specialist

## 2013-11-12 LAB — CBC WITH DIFFERENTIAL/PLATELET
BASOS PCT: 1.2 %
Basophil #: 0.1 10*3/uL (ref 0.0–0.1)
EOS ABS: 0.2 10*3/uL (ref 0.0–0.7)
EOS PCT: 2 %
HCT: 34.8 % — ABNORMAL LOW (ref 35.0–47.0)
HGB: 11.6 g/dL — AB (ref 12.0–16.0)
LYMPHS ABS: 2.1 10*3/uL (ref 1.0–3.6)
LYMPHS PCT: 22.9 %
MCH: 30.3 pg (ref 26.0–34.0)
MCHC: 33.4 g/dL (ref 32.0–36.0)
MCV: 91 fL (ref 80–100)
MONOS PCT: 5.7 %
Monocyte #: 0.5 x10 3/mm (ref 0.2–0.9)
Neutrophil #: 6.1 10*3/uL (ref 1.4–6.5)
Neutrophil %: 68.2 %
PLATELETS: 344 10*3/uL (ref 150–440)
RBC: 3.84 10*6/uL (ref 3.80–5.20)
RDW: 13.1 % (ref 11.5–14.5)
WBC: 9 10*3/uL (ref 3.6–11.0)

## 2013-11-12 LAB — COMPREHENSIVE METABOLIC PANEL
ALBUMIN: 2.6 g/dL — AB (ref 3.4–5.0)
ANION GAP: 7 (ref 7–16)
Alkaline Phosphatase: 95 U/L
BUN: 17 mg/dL (ref 7–18)
Bilirubin,Total: 0.3 mg/dL (ref 0.2–1.0)
CO2: 30 mmol/L (ref 21–32)
Calcium, Total: 8.7 mg/dL (ref 8.5–10.1)
Chloride: 98 mmol/L (ref 98–107)
Creatinine: 1.12 mg/dL (ref 0.60–1.30)
EGFR (Non-African Amer.): 59 — ABNORMAL LOW
Glucose: 160 mg/dL — ABNORMAL HIGH (ref 65–99)
OSMOLALITY: 275 (ref 275–301)
POTASSIUM: 4.3 mmol/L (ref 3.5–5.1)
SGOT(AST): 34 U/L (ref 15–37)
SGPT (ALT): 48 U/L (ref 12–78)
Sodium: 135 mmol/L — ABNORMAL LOW (ref 136–145)
TOTAL PROTEIN: 6.5 g/dL (ref 6.4–8.2)

## 2014-05-17 ENCOUNTER — Emergency Department: Payer: Self-pay | Admitting: Emergency Medicine

## 2014-05-18 LAB — URINALYSIS, COMPLETE
Bacteria: NONE SEEN
Bilirubin,UR: NEGATIVE
Blood: NEGATIVE
GLUCOSE, UR: NEGATIVE mg/dL (ref 0–75)
Ketone: NEGATIVE
Leukocyte Esterase: NEGATIVE
Nitrite: NEGATIVE
Ph: 5 (ref 4.5–8.0)
Protein: NEGATIVE
RBC,UR: 3 /HPF (ref 0–5)
SPECIFIC GRAVITY: 1.024 (ref 1.003–1.030)
Squamous Epithelial: 3
WBC UR: 2 /HPF (ref 0–5)

## 2014-06-22 ENCOUNTER — Emergency Department: Payer: Self-pay | Admitting: Emergency Medicine

## 2016-07-03 ENCOUNTER — Emergency Department (HOSPITAL_COMMUNITY): Payer: Managed Care, Other (non HMO) | Admitting: Anesthesiology

## 2016-07-03 ENCOUNTER — Inpatient Hospital Stay (HOSPITAL_COMMUNITY)
Admission: EM | Admit: 2016-07-03 | Discharge: 2016-07-06 | DRG: 355 | Disposition: A | Discharge status: Home / Self Care | Payer: Managed Care, Other (non HMO) | Attending: General Surgery | Admitting: General Surgery

## 2016-07-03 ENCOUNTER — Encounter (HOSPITAL_COMMUNITY): Admission: EM | Disposition: A | Discharge status: Home / Self Care | Payer: Self-pay | Source: Home / Self Care

## 2016-07-03 ENCOUNTER — Emergency Department (HOSPITAL_COMMUNITY): Payer: Managed Care, Other (non HMO)

## 2016-07-03 ENCOUNTER — Encounter (HOSPITAL_COMMUNITY): Payer: Self-pay | Admitting: Emergency Medicine

## 2016-07-03 DIAGNOSIS — Z791 Long term (current) use of non-steroidal anti-inflammatories (NSAID): Secondary | ICD-10-CM

## 2016-07-03 DIAGNOSIS — K436 Other and unspecified ventral hernia with obstruction, without gangrene: Secondary | ICD-10-CM

## 2016-07-03 DIAGNOSIS — Z6839 Body mass index (BMI) 39.0-39.9, adult: Secondary | ICD-10-CM | POA: Diagnosis not present

## 2016-07-03 DIAGNOSIS — K430 Incisional hernia with obstruction, without gangrene: Secondary | ICD-10-CM | POA: Diagnosis present

## 2016-07-03 DIAGNOSIS — R1030 Lower abdominal pain, unspecified: Secondary | ICD-10-CM | POA: Diagnosis present

## 2016-07-03 HISTORY — PX: UMBILICAL HERNIA REPAIR: SHX196

## 2016-07-03 HISTORY — DX: Prediabetes: R73.03

## 2016-07-03 HISTORY — DX: Malignant (primary) neoplasm, unspecified: C80.1

## 2016-07-03 LAB — COMPREHENSIVE METABOLIC PANEL
ALT: 10 U/L — ABNORMAL LOW (ref 14–54)
ANION GAP: 5 (ref 5–15)
AST: 17 U/L (ref 15–41)
Albumin: 3.8 g/dL (ref 3.5–5.0)
Alkaline Phosphatase: 83 U/L (ref 38–126)
BILIRUBIN TOTAL: 0.8 mg/dL (ref 0.3–1.2)
BUN: 12 mg/dL (ref 6–20)
CHLORIDE: 107 mmol/L (ref 101–111)
CO2: 25 mmol/L (ref 22–32)
Calcium: 8.5 mg/dL — ABNORMAL LOW (ref 8.9–10.3)
Creatinine, Ser: 0.88 mg/dL (ref 0.44–1.00)
GFR calc Af Amer: >60 mL/min (ref >60)
GFR calc non Af Amer: >60 mL/min (ref >60)
GLUCOSE: 109 mg/dL — AB (ref 65–99)
POTASSIUM: 3.9 mmol/L (ref 3.5–5.1)
SODIUM: 137 mmol/L (ref 135–145)
TOTAL PROTEIN: 8.4 g/dL — AB (ref 6.5–8.1)

## 2016-07-03 LAB — CBC WITH DIFFERENTIAL/PLATELET
BASOS ABS: 0 10*3/uL (ref 0.0–0.1)
BASOS PCT: 0 %
EOS ABS: 0.3 10*3/uL (ref 0.0–0.7)
EOS PCT: 2 %
HEMATOCRIT: 37.4 % (ref 36.0–46.0)
Hemoglobin: 12.2 g/dL (ref 12.0–15.0)
Lymphocytes Relative: 13 %
Lymphs Abs: 2.2 10*3/uL (ref 0.7–4.0)
MCH: 30.3 pg (ref 26.0–34.0)
MCHC: 32.6 g/dL (ref 30.0–36.0)
MCV: 93 fL (ref 78.0–100.0)
MONO ABS: 1.1 10*3/uL — AB (ref 0.1–1.0)
MONOS PCT: 7 %
Neutro Abs: 12.8 10*3/uL — ABNORMAL HIGH (ref 1.7–7.7)
Neutrophils Relative %: 78 %
PLATELETS: 335 10*3/uL (ref 150–400)
RBC: 4.02 MIL/uL (ref 3.87–5.11)
RDW: 12.9 % (ref 11.5–15.5)
WBC: 16.4 10*3/uL — ABNORMAL HIGH (ref 4.0–10.5)

## 2016-07-03 LAB — URINALYSIS, ROUTINE W REFLEX MICROSCOPIC
Bilirubin Urine: NEGATIVE
Glucose, UA: NEGATIVE mg/dL
KETONES UR: 15 mg/dL — AB
LEUKOCYTES UA: NEGATIVE
NITRITE: NEGATIVE
PH: 6.5 (ref 5.0–8.0)
PROTEIN: NEGATIVE mg/dL
Specific Gravity, Urine: 1.015 (ref 1.005–1.030)

## 2016-07-03 LAB — URINE MICROSCOPIC-ADD ON
Bacteria, UA: NONE SEEN
WBC UA: NONE SEEN WBC/hpf (ref 0–5)

## 2016-07-03 LAB — LIPASE, BLOOD: Lipase: 18 U/L (ref 11–51)

## 2016-07-03 SURGERY — REPAIR, HERNIA, UMBILICAL, ADULT
Anesthesia: General

## 2016-07-03 MED ORDER — BUPIVACAINE HCL (PF) 0.25 % IJ SOLN
INTRAMUSCULAR | Status: AC
  Filled 2016-07-03: qty 30

## 2016-07-03 MED ORDER — FENTANYL CITRATE (PF) 100 MCG/2ML IJ SOLN
INTRAMUSCULAR | Status: AC
  Filled 2016-07-03: qty 2

## 2016-07-03 MED ORDER — 0.9 % SODIUM CHLORIDE (POUR BTL) OPTIME
TOPICAL | Status: DC | PRN
  Administered 2016-07-03: 1000 mL

## 2016-07-03 MED ORDER — FAMOTIDINE IN NACL 20-0.9 MG/50ML-% IV SOLN
20.0000 mg | Freq: Two times a day (BID) | INTRAVENOUS | Status: DC
  Administered 2016-07-03 – 2016-07-05 (×5): 20 mg via INTRAVENOUS
  Filled 2016-07-03 (×6): qty 50

## 2016-07-03 MED ORDER — ENOXAPARIN SODIUM 30 MG/0.3ML ~~LOC~~ SOLN
30.0000 mg | Freq: Two times a day (BID) | SUBCUTANEOUS | Status: DC
  Administered 2016-07-04 – 2016-07-05 (×4): 30 mg via SUBCUTANEOUS
  Filled 2016-07-03 (×4): qty 0.3

## 2016-07-03 MED ORDER — ROCURONIUM BROMIDE 50 MG/5ML IV SOSY
PREFILLED_SYRINGE | INTRAVENOUS | Status: AC
  Filled 2016-07-03: qty 5

## 2016-07-03 MED ORDER — PROPOFOL 10 MG/ML IV BOLUS
INTRAVENOUS | Status: DC | PRN
  Administered 2016-07-03: 200 mg via INTRAVENOUS

## 2016-07-03 MED ORDER — ONDANSETRON HCL 4 MG/2ML IJ SOLN
INTRAMUSCULAR | Status: AC
  Filled 2016-07-03: qty 2

## 2016-07-03 MED ORDER — MORPHINE SULFATE (PF) 2 MG/ML IV SOLN
2.0000 mg | INTRAVENOUS | Status: DC | PRN
  Administered 2016-07-03 (×2): 2 mg via INTRAVENOUS
  Administered 2016-07-04 (×2): 4 mg via INTRAVENOUS
  Filled 2016-07-03 (×2): qty 1
  Filled 2016-07-03 (×3): qty 2

## 2016-07-03 MED ORDER — HYDROMORPHONE HCL 1 MG/ML IJ SOLN
1.0000 mg | Freq: Once | INTRAMUSCULAR | Status: AC
  Administered 2016-07-03: 1 mg via INTRAVENOUS
  Filled 2016-07-03: qty 1

## 2016-07-03 MED ORDER — METOCLOPRAMIDE HCL 5 MG/ML IJ SOLN: 10.0000 mg | Freq: Once | INTRAMUSCULAR | Status: DC | PRN

## 2016-07-03 MED ORDER — SUGAMMADEX SODIUM 500 MG/5ML IV SOLN
INTRAVENOUS | Status: AC
  Filled 2016-07-03: qty 5

## 2016-07-03 MED ORDER — BUPIVACAINE HCL (PF) 0.25 % IJ SOLN
INTRAMUSCULAR | Status: DC | PRN
  Administered 2016-07-03: 30 mL

## 2016-07-03 MED ORDER — IOPAMIDOL (ISOVUE-300) INJECTION 61%
100.0000 mL | Freq: Once | INTRAVENOUS | Status: AC | PRN
  Administered 2016-07-03: 100 mL via INTRAVENOUS

## 2016-07-03 MED ORDER — FENTANYL CITRATE (PF) 100 MCG/2ML IJ SOLN
INTRAMUSCULAR | Status: DC | PRN
  Administered 2016-07-03 (×2): 50 ug via INTRAVENOUS
  Administered 2016-07-03: 100 ug via INTRAVENOUS

## 2016-07-03 MED ORDER — ONDANSETRON 4 MG PO TBDP: 4.0000 mg | ORAL_TABLET | Freq: Four times a day (QID) | ORAL | Status: DC | PRN

## 2016-07-03 MED ORDER — DEXTROSE 5 % IV SOLN
INTRAVENOUS | Status: DC | PRN
  Administered 2016-07-03: 2 g via INTRAVENOUS

## 2016-07-03 MED ORDER — MIDAZOLAM HCL 5 MG/5ML IJ SOLN
INTRAMUSCULAR | Status: DC | PRN
  Administered 2016-07-03: 2 mg via INTRAVENOUS

## 2016-07-03 MED ORDER — SUCCINYLCHOLINE CHLORIDE 20 MG/ML IJ SOLN
INTRAMUSCULAR | Status: AC
  Filled 2016-07-03: qty 1

## 2016-07-03 MED ORDER — ROCURONIUM BROMIDE 100 MG/10ML IV SOLN
INTRAVENOUS | Status: DC | PRN
  Administered 2016-07-03: 40 mg via INTRAVENOUS

## 2016-07-03 MED ORDER — HYDROMORPHONE HCL 1 MG/ML IJ SOLN: 0.2500 mg | INTRAMUSCULAR | Status: DC | PRN

## 2016-07-03 MED ORDER — SODIUM CHLORIDE 0.9 % IV BOLUS (SEPSIS)
500.0000 mL | Freq: Once | INTRAVENOUS | Status: AC
  Administered 2016-07-03: 500 mL via INTRAVENOUS

## 2016-07-03 MED ORDER — ONDANSETRON HCL 4 MG/2ML IJ SOLN: 4.0000 mg | Freq: Four times a day (QID) | INTRAMUSCULAR | Status: DC | PRN

## 2016-07-03 MED ORDER — SUCCINYLCHOLINE CHLORIDE 200 MG/10ML IV SOSY
PREFILLED_SYRINGE | INTRAVENOUS | Status: DC | PRN
  Administered 2016-07-03: 120 mg via INTRAVENOUS

## 2016-07-03 MED ORDER — MEPERIDINE HCL 50 MG/ML IJ SOLN: 6.2500 mg | INTRAMUSCULAR | Status: DC | PRN

## 2016-07-03 MED ORDER — ONDANSETRON HCL 4 MG/2ML IJ SOLN
4.0000 mg | Freq: Once | INTRAMUSCULAR | Status: AC
  Administered 2016-07-03: 4 mg via INTRAVENOUS
  Filled 2016-07-03: qty 2

## 2016-07-03 MED ORDER — DEXAMETHASONE SODIUM PHOSPHATE 10 MG/ML IJ SOLN
INTRAMUSCULAR | Status: AC
  Filled 2016-07-03: qty 1

## 2016-07-03 MED ORDER — LIDOCAINE HCL (CARDIAC) 20 MG/ML IV SOLN
INTRAVENOUS | Status: DC | PRN
  Administered 2016-07-03: 50 mg via INTRAVENOUS

## 2016-07-03 MED ORDER — MIDAZOLAM HCL 2 MG/2ML IJ SOLN
INTRAMUSCULAR | Status: AC
  Filled 2016-07-03: qty 2

## 2016-07-03 MED ORDER — SUGAMMADEX SODIUM 500 MG/5ML IV SOLN
INTRAVENOUS | Status: DC | PRN
  Administered 2016-07-03: 500 mg via INTRAVENOUS

## 2016-07-03 MED ORDER — ONDANSETRON HCL 4 MG/2ML IJ SOLN
INTRAMUSCULAR | Status: DC | PRN
  Administered 2016-07-03: 4 mg via INTRAVENOUS

## 2016-07-03 MED ORDER — OXYCODONE-ACETAMINOPHEN 5-325 MG PO TABS
1.0000 | ORAL_TABLET | ORAL | Status: DC | PRN
  Administered 2016-07-04: 1 via ORAL
  Administered 2016-07-04: 2 via ORAL
  Administered 2016-07-04: 1 via ORAL
  Administered 2016-07-05: 2 via ORAL
  Administered 2016-07-05: 1 via ORAL
  Filled 2016-07-03: qty 1
  Filled 2016-07-03 (×2): qty 2
  Filled 2016-07-03: qty 1
  Filled 2016-07-03: qty 2

## 2016-07-03 MED ORDER — LIDOCAINE 2% (20 MG/ML) 5 ML SYRINGE
INTRAMUSCULAR | Status: AC
  Filled 2016-07-03: qty 5

## 2016-07-03 MED ORDER — DEXAMETHASONE SODIUM PHOSPHATE 10 MG/ML IJ SOLN
INTRAMUSCULAR | Status: DC | PRN
  Administered 2016-07-03: 10 mg via INTRAVENOUS

## 2016-07-03 MED ORDER — LACTATED RINGERS IV SOLN
INTRAVENOUS | Status: DC | PRN
  Administered 2016-07-03 (×2): via INTRAVENOUS

## 2016-07-03 MED ORDER — CEFOTETAN DISODIUM-DEXTROSE 2-2.08 GM-% IV SOLR
INTRAVENOUS | Status: AC
  Filled 2016-07-03: qty 50

## 2016-07-03 MED ORDER — PROPOFOL 10 MG/ML IV BOLUS
INTRAVENOUS | Status: AC
  Filled 2016-07-03: qty 40

## 2016-07-03 MED ORDER — POTASSIUM CHLORIDE IN NACL 20-0.9 MEQ/L-% IV SOLN
INTRAVENOUS | Status: DC
  Administered 2016-07-03 – 2016-07-04 (×2): via INTRAVENOUS
  Filled 2016-07-03 (×5): qty 1000

## 2016-07-03 SURGICAL SUPPLY — 38 items
BENZOIN TINCTURE PRP APPL 2/3 (GAUZE/BANDAGES/DRESSINGS) IMPLANT
BLADE HEX COATED 2.75 (ELECTRODE) ×3 IMPLANT
BLADE SURG SZ10 CARB STEEL (BLADE) ×3 IMPLANT
CLOSURE WOUND 1/2 X4 (GAUZE/BANDAGES/DRESSINGS)
COVER SURGICAL LIGHT HANDLE (MISCELLANEOUS) ×3 IMPLANT
DECANTER SPIKE VIAL GLASS SM (MISCELLANEOUS) ×3 IMPLANT
DERMABOND ADVANCED (GAUZE/BANDAGES/DRESSINGS)
DERMABOND ADVANCED .7 DNX12 (GAUZE/BANDAGES/DRESSINGS) IMPLANT
DRAPE LAPAROTOMY T 102X78X121 (DRAPES) ×3 IMPLANT
ELECT PENCIL ROCKER SW 15FT (MISCELLANEOUS) ×3 IMPLANT
ELECT REM PT RETURN 9FT ADLT (ELECTROSURGICAL) ×3
ELECTRODE REM PT RTRN 9FT ADLT (ELECTROSURGICAL) ×1 IMPLANT
GAUZE SPONGE 4X4 12PLY STRL (GAUZE/BANDAGES/DRESSINGS) ×3 IMPLANT
GLOVE BIOGEL PI IND STRL 7.0 (GLOVE) ×1 IMPLANT
GLOVE BIOGEL PI INDICATOR 7.0 (GLOVE) ×2
GOWN STRL REUS W/TWL LRG LVL3 (GOWN DISPOSABLE) ×3 IMPLANT
GOWN STRL REUS W/TWL XL LVL3 (GOWN DISPOSABLE) ×6 IMPLANT
KIT BASIN OR (CUSTOM PROCEDURE TRAY) ×3 IMPLANT
MARKER SKIN DUAL TIP RULER LAB (MISCELLANEOUS) IMPLANT
NEEDLE HYPO 22GX1.5 SAFETY (NEEDLE) ×3 IMPLANT
NEEDLE HYPO 25X1 1.5 SAFETY (NEEDLE) ×3 IMPLANT
NS IRRIG 1000ML POUR BTL (IV SOLUTION) ×3 IMPLANT
PACK BASIC VI WITH GOWN DISP (CUSTOM PROCEDURE TRAY) ×3 IMPLANT
SPONGE LAP 4X18 X RAY DECT (DISPOSABLE) ×3 IMPLANT
STRIP CLOSURE SKIN 1/2X4 (GAUZE/BANDAGES/DRESSINGS) IMPLANT
SUT MNCRL AB 4-0 PS2 18 (SUTURE) ×3 IMPLANT
SUT NOVA NAB DX-16 0-1 5-0 T12 (SUTURE) ×6 IMPLANT
SUT PROLENE 0 CT 1 30 (SUTURE) IMPLANT
SUT PROLENE 0 CT 1 CR/8 (SUTURE) IMPLANT
SUT PROLENE 0 CT 2 (SUTURE) IMPLANT
SUT SILK 3 0 (SUTURE)
SUT SILK 3 0 SH CR/8 (SUTURE) IMPLANT
SUT SILK 3-0 18XBRD TIE 12 (SUTURE) IMPLANT
SUT VIC AB 3-0 SH 27 (SUTURE)
SUT VIC AB 3-0 SH 27XBRD (SUTURE) IMPLANT
SYR CONTROL 10ML LL (SYRINGE) ×3 IMPLANT
TAPE CLOTH SURG 4X10 WHT LF (GAUZE/BANDAGES/DRESSINGS) ×3 IMPLANT
TOWEL OR 17X26 10 PK STRL BLUE (TOWEL DISPOSABLE) ×3 IMPLANT

## 2016-07-03 NOTE — Anesthesia Procedure Notes (Signed)
Procedure Name: Intubation Date/Time: 07/03/2016 7:43 PM Performed by: Emerlyn Mehlhoff, Virgel Gess Pre-anesthesia Checklist: Patient identified, Emergency Drugs available, Suction available, Patient being monitored and Timeout performed Patient Re-evaluated:Patient Re-evaluated prior to inductionOxygen Delivery Method: Circle system utilized Preoxygenation: Pre-oxygenation with 100% oxygen Intubation Type: IV induction and Cricoid Pressure applied Ventilation: Mask ventilation without difficulty Laryngoscope Size: Mac and 4 Grade View: Grade I Tube type: Oral Tube size: 7.5 mm Number of attempts: 1 Airway Equipment and Method: Stylet Placement Confirmation: ETT inserted through vocal cords under direct vision,  positive ETCO2,  CO2 detector and breath sounds checked- equal and bilateral Secured at: 21 cm Tube secured with: Tape Dental Injury: Teeth and Oropharynx as per pre-operative assessment

## 2016-07-03 NOTE — Op Note (Signed)
Preoperative Diagnosis: Incarcerated incisional hernia with small bowel obstruction  Postoprative Diagnosis: Same  Procedure: Procedure(s): INCARCERATED INCISIONAL HERNIA REPAIR   Surgeon: Excell Seltzer T   Assistants: None  Anesthesia:  General endotracheal anesthesia  Indications: Patient is a 50 year old female with morbid obesity and previous history of laparoscopic appendectomy remotely. She presents with several weeks of intermittent umbilical pain in 24 hours of persistent severe periumbilical pain. She has a tender mass at the umbilicus and CT scan has shown an incarcerated hernia with small bowel and partial obstruction. This is the site of her previous laparoscopic appendectomy incision. I recommended emergency repair. I discussed the nature of the surgery and indications as well as risks of general anesthesia, bleeding, infection, possible need for bowel resection and risk of recurrent hernia. Her questions were answered and she understands and agrees to proceed.    Procedure Detail:   Patient was brought to the operating room, placed in the supine position on the operating table, and general endotracheal anesthesia induced. She received preoperative IV antibiotics. PAS were in place. The abdomen was widely sterilely prepped and draped. Patient timeout was performed and correct procedure verified. I used a fairly small midline incision skirting the umbilicus and dissection was carried down in this epitendinous tissue. The hernia sac was encountered. The hernia sac was sharply dissected away from surrounding tissue and the umbilical skin. The hernia defect was just below the umbilicus. The hernia sac was completely dissected down to the level of the fascia. The hernia sac was opened and contained a small amount of slightly blood-tinged fluid. The sac contained incarcerated loop of small intestine which was very dusky and slightly hemorrhagic. The fascial defect was lengthened very  slightly inferiorly which freed up the incarceration and the small bowel could be reduced. I observed the bowel for several minutes and it pinked up very quickly and had just some slight hemorrhagic areas along the antimesenteric border but appeared viable and intact and I elected not to resect. The bowel was completely reduced into the abdominal cavity. The remainder of the hernia sac was dissected away from the level of the fascia with cautery. This left an approximately 3 cm midline incision with relatively healthy-appearing fascia. I did not want to use mesh due to the possibility of contamination from the incarcerated small bowel. The fascia was closed in the midline with interrupted 0 Novafil sutures. Subcutaneous tissue was irrigated and skin was closed with staples. Sponge needle and instrument counts were correct.    Findings: Incarcerated incisional hernia with small bowel, viable  Estimated Blood Loss:  Minimal         Drains: None  Blood Given: none          Specimens: None        Complications:  * No complications entered in OR log *         Disposition: PACU - hemodynamically stable.         Condition: stable

## 2016-07-03 NOTE — Transfer of Care (Signed)
Immediate Anesthesia Transfer of Care Note  Patient: Patricia Harris [facility]  Procedure(s) Performed: Procedure(s): [place] (N/A)  Patient Location: PACU  Anesthesia Type:General  Level of Consciousness:  sedated, patient cooperative and responds to stimulation  Airway & Oxygen Therapy:Patient Spontanous Breathing and Patient connected to face mask oxgen  Post-op Assessment:  Report given to PACU RN and Post -op Vital signs reviewed and stable  Post vital signs:  Reviewed and stable  Last Vitals:  Vitals:   07/03/16 1819 07/03/16 2027  BP: 131/92 121/75  Pulse: 68 75  Resp: 18   Temp:  [id number] C    Complications: No apparent anesthesia complications

## 2016-07-03 NOTE — ED Provider Notes (Signed)
[facility] DEPT Provider Note   CSN: [id number] Arrival date & time: 07/03/16  1406     History   Chief Complaint Chief Complaint  Patient presents with  . Abdominal Pain  . Nausea    HPI Patricia Harris is a 50 y.o. female.  Patient complains of periumbilical abdominal pain with nausea for 24 hours. Patient has a history of abdominal surgery with appendix removed and the sleeve put on her stomach   The history is provided by the patient. No language interpreter was used.  Abdominal Pain   This is a new problem. The problem occurs constantly. The problem has not changed since onset.The pain is associated with an unknown factor. The pain is located in the periumbilical region. The quality of the pain is dull. The pain is at a severity of 5/10. The pain is moderate. Pertinent negatives include diarrhea, frequency, hematuria and headaches.    Past Medical History:  Diagnosis Date  . History of kidney cancer    LEFT    There are no active problems to display for this patient.   Past Surgical History:  Procedure Laterality Date  . APPENDECTOMY    . KIDNEY SURGERY  2004   LEFT KIDNEY REMOVED     OB History    No data available       Home Medications    Prior to Admission medications   Medication Sig Start Date End Date Taking? Authorizing Provider  ibuprofen (ADVIL,MOTRIN) 200 MG tablet Take 400 mg by mouth every 6 (six) hours as needed for moderate pain.   Yes Historical Provider, MD  Pseudoephedrine-APAP-DM (DAYQUIL PO) Take 30 mLs by mouth daily as needed (cold symptoms).   Yes Historical Provider, MD  cyclobenzaprine (FLEXERIL) 10 MG tablet Take 10 mg by mouth 3 (three) times daily as needed for muscle spasms.  10/29/10   Historical Provider, MD  naproxen (NAPROSYN) 500 MG tablet Take by mouth. 10/29/10   Historical Provider, MD    Family History No family history on file.  Social History Social History  Substance Use Topics  . Smoking status: Never  Smoker  . Smokeless tobacco: Never Used  . Alcohol use No     Allergies   Patient has no known allergies.   Review of Systems Review of Systems  Constitutional: Negative for appetite change and fatigue.  HENT: Negative for congestion, ear discharge and sinus pressure.   Eyes: Negative for discharge.  Respiratory: Negative for cough.   Cardiovascular: Negative for chest pain.  Gastrointestinal: Positive for abdominal pain. Negative for diarrhea.  Genitourinary: Negative for frequency and hematuria.  Musculoskeletal: Negative for back pain.  Skin: Negative for rash.  Neurological: Negative for seizures and headaches.  Psychiatric/Behavioral: Negative for hallucinations.     Physical Exam Updated Vital Signs BP 131/92 (BP Location: Right Arm)   Pulse 68   Temp 98.9 F (37.2 C) (Oral)   Resp 18   SpO2 97%   Physical Exam  Constitutional: She is oriented to person, place, and time. She appears well-developed.  HENT:  Head: Normocephalic.  Eyes: Conjunctivae and EOM are normal. No scleral icterus.  Neck: Neck supple. No thyromegaly present.  Cardiovascular: Normal rate and regular rhythm.  Exam reveals no gallop and no friction rub.   No murmur heard. Pulmonary/Chest: No stridor. She has no wheezes. She has no rales. She exhibits no tenderness.  Abdominal: She exhibits no distension. There is tenderness. There is no rebound.  Musculoskeletal: Normal range of motion. She  exhibits no edema.  Lymphadenopathy:    She has no cervical adenopathy.  Neurological: She is oriented to person, place, and time. She exhibits normal muscle tone. Coordination normal.  Skin: No rash noted. No erythema.  Psychiatric: She has a normal mood and affect. Her behavior is normal.     ED Treatments / Results  Labs (all labs ordered are listed, but only abnormal results are displayed) Labs Reviewed  CBC WITH DIFFERENTIAL/PLATELET - Abnormal; Notable for the following:       Result Value    WBC 16.4 (*)    Neutro Abs 12.8 (*)    Monocytes Absolute 1.1 (*)    All other components within normal limits  COMPREHENSIVE METABOLIC PANEL - Abnormal; Notable for the following:    Glucose, Bld 109 (*)    Calcium 8.5 (*)    Total Protein 8.4 (*)    ALT 10 (*)    All other components within normal limits  URINALYSIS, ROUTINE W REFLEX MICROSCOPIC (NOT AT [facility]) - Abnormal; Notable for the following:    Hgb urine dipstick TRACE (*)    Ketones, ur 15 (*)    All other components within normal limits  URINE MICROSCOPIC-ADD ON - Abnormal; Notable for the following:    Squamous Epithelial / LPF 0-5 (*)    All other components within normal limits  LIPASE, BLOOD    EKG  EKG Interpretation None       Radiology Ct Abdomen Pelvis W Contrast  Result Date: 07/03/2016 CLINICAL DATA:  Burning periumbilical abdominal pain. Associated nausea. EXAM: CT ABDOMEN AND PELVIS WITH CONTRAST TECHNIQUE: Multidetector CT imaging of the abdomen and pelvis was performed using the standard protocol following bolus administration of intravenous contrast. CONTRAST:  150mL ISOVUE-300 IOPAMIDOL (ISOVUE-300) INJECTION 61% COMPARISON:  09/18/2009 FINDINGS: Lower chest: No acute abnormality. Hepatobiliary: No focal liver abnormality is seen. No gallstones, gallbladder wall thickening, or biliary dilatation. Pancreas: Unremarkable. No pancreatic ductal dilatation or surrounding inflammatory changes. Spleen: Normal in size without focal abnormality. Adrenals/Urinary Tract: Post left nephrectomy. The right kidney demonstrates no evident of nephrolithiasis or hydronephrosis. There is a slightly irregular cystic lesion in the midpole of the right kidney measuring 1.6 x 1.2 by 1.1 cm. Stomach/Bowel: There is a narrow neck periumbilical anterior abdominal wall hernia containing short segment of fluid-filled mildly dilated small bowel loop, with associated mild mesenteric stranding. There is a mild dilation, which is sub  pathologic by CT criteria of the upstream small bowel loops. Postsurgical changes at the GE junction an along the greater curvature of the stomach are seen. Vascular/Lymphatic: No significant vascular findings are present. No enlarged abdominal or pelvic lymph nodes. Reproductive: Uterus and left adnexa are unremarkable. There is a 3.3 cm biphasic complicated cystic structure in the right adnexa. Other: None. Musculoskeletal: Persistent fat containing left flank hernia. There is a 2.6 cm mixed lytic and sclerotic lesion within the right iliac bone adjacent to the anterior portion of the right sacroiliac joint. IMPRESSION: Narrow neck periumbilical anterior abdominal wall hernia containing short segment of fluid-filled mildly dilated small bowel loop, with mild associated mesenteric stranding, suspicious for incarceration. Mild upstream dilation of small bowel loops, which may be due to ileus or very early small bowel obstruction. Surgical constant is recommended. 1.6 cm irregular right renal cystic lesion. 3.3 cm biphasic complicated right adnexal cystic structure. 2.6 cm mixed lytic and sclerotic lesion within the right iliac bone, adjacent to the anterior portion of the right sacroiliac joint. Given patient's history of  prior renal cell carcinoma, PET-CT or contrast-enhanced MRI of the abdomen and pelvis may be considered to further evaluate these findings. These results were called by telephone at the time of interpretation on 07/03/2016 at 4:41 pm to Dr. Milton Ferguson , who verbally acknowledged these results. Electronically Signed   By: Fidela Salisbury M.D.   On: 07/03/2016 16:48    Procedures Procedures (including critical care time)  Medications Ordered in ED Medications  HYDROmorphone (DILAUDID) injection 1 mg (1 mg Intravenous Given 07/03/16 1507)  ondansetron (ZOFRAN) injection 4 mg (4 mg Intravenous Given 07/03/16 1507)  sodium chloride 0.9 % bolus 500 mL (500 mLs Intravenous New Bag/Given  07/03/16 1507)  iopamidol (ISOVUE-300) 61 % injection 100 mL (100 mLs Intravenous Contrast Given 07/03/16 1606)     Initial Impression / Assessment and Plan / ED Course  I have reviewed the triage vital signs and the nursing notes.  Pertinent labs & imaging results that were available during my care of the patient were reviewed by me and considered in my medical decision making (see chart for details).  Clinical Course     CT scan shows dilated small bowel loops possible incarceration. Surgery to see patient  Final Clinical Impressions(s) / ED Diagnoses   Final diagnoses:  None    New Prescriptions New Prescriptions   No medications on file     Milton Ferguson, MD 07/03/16 1825

## 2016-07-03 NOTE — Anesthesia Preprocedure Evaluation (Addendum)
Anesthesia Evaluation  Patient identified by MRN, date of birth, ID band Patient awake    Reviewed: Allergy & Precautions, NPO status , Patient's Chart, lab work & pertinent test results  Airway Mallampati: II  TM Distance: >3 FB Neck ROM: Full    Dental no notable dental hx. (+) Teeth Intact   Pulmonary neg pulmonary ROS,    Pulmonary exam normal breath sounds clear to auscultation       Cardiovascular negative cardio ROS Normal cardiovascular exam Rhythm:Regular Rate:Normal     Neuro/Psych negative neurological ROS  negative psych ROS   GI/Hepatic negative GI ROS, Neg liver ROS,   Endo/Other  negative endocrine ROSMorbid obesity  Renal/GU Hx/o left renal carcinoma S/P left nephrectomy  negative genitourinary   Musculoskeletal Incarcerated incisional hernia   Abdominal (+) + obese,   Peds  Hematology negative hematology ROS (+)   Anesthesia Other Findings   Reproductive/Obstetrics                            Lab Results  Component Value Date   WBC 16.4 (H) 07/03/2016   HGB 12.2 07/03/2016   HCT 37.4 07/03/2016   MCV 93.0 07/03/2016   PLT 335 07/03/2016     Chemistry      Component Value Date/Time   NA 137 07/03/2016 1508   NA 135 (L) 11/12/2013 0838   K 3.9 07/03/2016 1508   K 4.3 11/12/2013 0838   CL 107 07/03/2016 1508   CL 98 11/12/2013 0838   CO2 25 07/03/2016 1508   CO2 30 11/12/2013 0838   BUN 12 07/03/2016 1508   BUN 17 11/12/2013 0838   CREATININE 0.88 07/03/2016 1508   CREATININE 1.12 11/12/2013 0838      Component Value Date/Time   CALCIUM 8.5 (L) 07/03/2016 1508   CALCIUM 8.7 11/12/2013 0838   ALKPHOS 83 07/03/2016 1508   ALKPHOS 95 11/12/2013 0838   AST 17 07/03/2016 1508   AST 34 11/12/2013 0838   ALT 10 (L) 07/03/2016 1508   ALT 48 11/12/2013 0838   BILITOT 0.8 07/03/2016 1508   BILITOT 0.3 11/12/2013 0838    EKG 02/01/2013 : Sinus Bradycardia, Non  specific ST-T wave abnormality. Stress Echo 09/19/2012: The stress ECG was negative for ischemia. Anesthesia Physical Anesthesia Plan  ASA: III and emergent  Anesthesia Plan: General   Post-op Pain Management:    Induction: Intravenous, Rapid sequence and Cricoid pressure planned  Airway Management Planned: Oral ETT  Additional Equipment:   Intra-op Plan:   Post-operative Plan: Extubation in OR  Informed Consent: I have reviewed the patients History and Physical, chart, labs and discussed the procedure including the risks, benefits and alternatives for the proposed anesthesia with the patient or authorized representative who has indicated his/her understanding and acceptance.   Dental advisory given  Plan Discussed with: Anesthesiologist, CRNA and Surgeon  Anesthesia Plan Comments:         Anesthesia Quick Evaluation

## 2016-07-03 NOTE — H&P (Signed)
Patricia Harris is an 50 y.o. female.    Chief Complaint: Abdominal pain  HPI: She is a pleasant 50 year old female with a past history of laparoscopic appendectomy approximately 2004. In the last several weeks she has noted gradually worsening intermittent periumbilical abdominal pain. It would come and go and she did not seek attention. However over the last 24 hours she has had much more persistent and severe pain around the umbilicus. This has been associated with nausea but no vomiting. No constipation or diarrhea. She was not aware she had a hernia.  Past Medical History:  Diagnosis Date  . History of kidney cancer    LEFT    Past Surgical History:  Procedure Laterality Date  . APPENDECTOMY    . KIDNEY SURGERY  2004   LEFT KIDNEY REMOVED     History reviewed. No pertinent family history. Social History:  reports that she has never smoked. She has never used smokeless tobacco. She reports that she does not drink alcohol or use drugs.  Allergies: No Known Allergies  No current facility-administered medications for this encounter.    Current Outpatient Prescriptions  Medication Sig Dispense Refill  . ibuprofen (ADVIL,MOTRIN) 200 MG tablet Take 400 mg by mouth every 6 (six) hours as needed for moderate pain.    . Pseudoephedrine-APAP-DM (DAYQUIL PO) Take 30 mLs by mouth daily as needed (cold symptoms).    . cyclobenzaprine (FLEXERIL) 10 MG tablet Take 10 mg by mouth 3 (three) times daily as needed for muscle spasms.     . naproxen (NAPROSYN) 500 MG tablet Take by mouth.     Facility-Administered Medications Ordered in Other Encounters  Medication Dose Route Frequency Provider Last Rate Last Dose  . lactated ringers infusion    Continuous PRN Kristopher Key, CRNA         Results for orders placed or performed during the hospital encounter of 07/03/16 (from the past 48 hour(s))  CBC with Differential/Platelet     Status: Abnormal   Collection Time: 07/03/16  3:08 PM  Result  Value Ref Range   WBC 16.4 (H) 4.0 - 10.5 K/uL   RBC 4.02 3.87 - 5.11 MIL/uL   Hemoglobin 12.2 12.0 - 15.0 g/dL   HCT 37.4 36.0 - 46.0 %   MCV 93.0 78.0 - 100.0 fL   MCH 30.3 26.0 - 34.0 pg   MCHC 32.6 30.0 - 36.0 g/dL   RDW 12.9 11.5 - 15.5 %   Platelets 335 150 - 400 K/uL   Neutrophils Relative % 78 %   Neutro Abs 12.8 (H) 1.7 - 7.7 K/uL   Lymphocytes Relative 13 %   Lymphs Abs 2.2 0.7 - 4.0 K/uL   Monocytes Relative 7 %   Monocytes Absolute 1.1 (H) 0.1 - 1.0 K/uL   Eosinophils Relative 2 %   Eosinophils Absolute 0.3 0.0 - 0.7 K/uL   Basophils Relative 0 %   Basophils Absolute 0.0 0.0 - 0.1 K/uL  Comprehensive metabolic panel     Status: Abnormal   Collection Time: 07/03/16  3:08 PM  Result Value Ref Range   Sodium 137 135 - 145 mmol/L   Potassium 3.9 3.5 - 5.1 mmol/L   Chloride 107 101 - 111 mmol/L   CO2 25 22 - 32 mmol/L   Glucose, Bld 109 (H) 65 - 99 mg/dL   BUN 12 6 - 20 mg/dL   Creatinine, Ser 0.88 0.44 - 1.00 mg/dL   Calcium 8.5 (L) 8.9 - 10.3 mg/dL   Total  Protein 8.4 (H) 6.5 - 8.1 g/dL   Albumin 3.8 3.5 - 5.0 g/dL   AST 17 15 - 41 U/L   ALT 10 (L) 14 - 54 U/L   Alkaline Phosphatase 83 38 - 126 U/L   Total Bilirubin 0.8 0.3 - 1.2 mg/dL   GFR calc non Af Amer >60 >60 mL/min   GFR calc Af Amer >60 >60 mL/min    Comment: (NOTE) The eGFR has been calculated using the CKD EPI equation. This calculation has not been validated in all clinical situations. eGFR's persistently <60 mL/min signify possible Chronic Kidney Disease.    Anion gap 5 5 - 15  Lipase, blood     Status: None   Collection Time: 07/03/16  3:08 PM  Result Value Ref Range   Lipase 18 11 - 51 U/L  Urinalysis, Routine w reflex microscopic (not at [facility])     Status: Abnormal   Collection Time: 07/03/16  3:46 PM  Result Value Ref Range   Color, Urine YELLOW YELLOW   APPearance CLEAR CLEAR   Specific Gravity, Urine 1.015 1.005 - 1.030   pH 6.5 5.0 - 8.0   Glucose, UA NEGATIVE NEGATIVE mg/dL   Hgb  urine dipstick TRACE (A) NEGATIVE   Bilirubin Urine NEGATIVE NEGATIVE   Ketones, ur 15 (A) NEGATIVE mg/dL   Protein, ur NEGATIVE NEGATIVE mg/dL   Nitrite NEGATIVE NEGATIVE   Leukocytes, UA NEGATIVE NEGATIVE  Urine microscopic-add on     Status: Abnormal   Collection Time: 07/03/16  3:46 PM  Result Value Ref Range   Squamous Epithelial / LPF 0-5 (A) NONE SEEN   WBC, UA NONE SEEN 0 - 5 WBC/hpf   RBC / HPF 0-5 0 - 5 RBC/hpf   Bacteria, UA NONE SEEN NONE SEEN   Ct Abdomen Pelvis W Contrast  Result Date: 07/03/2016 CLINICAL DATA:  Burning periumbilical abdominal pain. Associated nausea. EXAM: CT ABDOMEN AND PELVIS WITH CONTRAST TECHNIQUE: Multidetector CT imaging of the abdomen and pelvis was performed using the standard protocol following bolus administration of intravenous contrast. CONTRAST:  ISOVUE-300 IOPAMIDOL (ISOVUE-300) INJECTION 61% COMPARISON:  09/18/2009 FINDINGS: Lower chest: No acute abnormality. Hepatobiliary: No focal liver abnormality is seen. No gallstones, gallbladder wall thickening, or biliary dilatation. Pancreas: Unremarkable. No pancreatic ductal dilatation or surrounding inflammatory changes. Spleen: Normal in size without focal abnormality. Adrenals/Urinary Tract: Post left nephrectomy. The right kidney demonstrates no evident of nephrolithiasis or hydronephrosis. There is a slightly irregular cystic lesion in the midpole of the right kidney measuring 1.6 x 1.2 by 1.1 cm. Stomach/Bowel: There is a narrow neck periumbilical anterior abdominal wall hernia containing short segment of fluid-filled mildly dilated small bowel loop, with associated mild mesenteric stranding. There is a mild dilation, which is sub pathologic by CT criteria of the upstream small bowel loops. Postsurgical changes at the GE junction an along the greater curvature of the stomach are seen. Vascular/Lymphatic: No significant vascular findings are present. No enlarged abdominal or pelvic lymph nodes.  Reproductive: Uterus and left adnexa are unremarkable. There is a 3.3 cm biphasic complicated cystic structure in the right adnexa. Other: None. Musculoskeletal: Persistent fat containing left flank hernia. There is a 2.6 cm mixed lytic and sclerotic lesion within the right iliac bone adjacent to the anterior portion of the right sacroiliac joint. IMPRESSION: Narrow neck periumbilical anterior abdominal wall hernia containing short segment of fluid-filled mildly dilated small bowel loop, with mild associated mesenteric stranding, suspicious for incarceration. Mild upstream dilation of small bowel  loops, which may be due to ileus or very early small bowel obstruction. Surgical constant is recommended. 1.6 cm irregular right renal cystic lesion. 3.3 cm biphasic complicated right adnexal cystic structure. 2.6 cm mixed lytic and sclerotic lesion within the right iliac bone, adjacent to the anterior portion of the right sacroiliac joint. Given patient's history of prior renal cell carcinoma, PET-CT or contrast-enhanced MRI of the abdomen and pelvis may be considered to further evaluate these findings. These results were called by telephone at the time of interpretation on 07/03/2016 at 4:41 pm to Dr. Milton Ferguson , who verbally acknowledged these results. Electronically Signed   By: Fidela Salisbury M.D.   On: 07/03/2016 16:48    Review of Systems  Constitutional: Negative for chills and fever.  Respiratory: Negative.   Cardiovascular: Negative.   Gastrointestinal: Positive for abdominal pain and nausea. Negative for blood in stool, constipation, melena and vomiting.  Genitourinary: Negative.     Blood pressure 131/92, pulse 68, temperature 98.9 F (37.2 C), temperature source Oral, resp. rate 18, SpO2 97 %. Physical Exam  General: Alert, morbidly obese African-American female, in mild distress Skin: Warm and dry without rash or infection. HEENT: No palpable masses or thyromegaly. Sclera nonicteric.  Pupils equal round and reactive.  Lungs: Breath sounds clear and equal without increased work of breathing Cardiovascular: Regular rate and rhythm without murmur. No JVD or edema. Peripheral pulses intact. Abdomen: Obese. Nondistended. There is a markedly tender mass palpable at the umbilicus. Mild surrounding abdominal tenderness. Bowel sounds present. Extremities: No edema or joint swelling or deformity. No chronic venous stasis changes. Neurologic: Alert and fully oriented. Affect normal. No gross motor or sensory deficits.  Assessment/Plan Incarcerated periumbilical incisional hernia containing bowel with partial small bowel obstruction. She has elevated white count and a markedly tender hernia and I'm concerned about potential ischemia. I have recommended proceeding with emergency open repair. I discussed the findings and indications a nature of surgery as well as risks of bleeding, infection, possible need for bowel resection, anesthetic risks and risk of recurrent hernia. All questions answered and she understands and agrees to proceed.  Edward Jolly, MD 07/03/2016, 6:28 PM

## 2016-07-03 NOTE — Anesthesia Postprocedure Evaluation (Signed)
Anesthesia Post Note  Patient: Patricia Harris [facility]  Procedure(s) Performed: Procedure(s) (LRB): INCARCERATED INCISIONAL HERNIA REPAIR (N/A)  Patient location during evaluation: PACU Anesthesia Type: General Level of consciousness: awake and alert and oriented Pain management: pain level controlled Vital Signs Assessment: post-procedure vital signs reviewed and stable Respiratory status: spontaneous breathing, nonlabored ventilation, respiratory function stable and patient connected to nasal cannula oxygen Cardiovascular status: blood pressure returned to baseline and stable Postop Assessment: no signs of nausea or vomiting Anesthetic complications: no    Last Vitals:  Vitals:   07/03/16 2045 07/03/16 2100  BP: 131/83 131/85  Pulse:    Resp:    Temp:      Last Pain:  Vitals:   07/03/16 2045  TempSrc:   PainSc: 5                  Patricia Harris A.

## 2016-07-03 NOTE — ED Triage Notes (Signed)
Patient here from home with complaints of abdominal pain described as "burning" near umbilicus. Nausea no vomiting. Started 1 hour ago.

## 2016-07-04 ENCOUNTER — Encounter (HOSPITAL_COMMUNITY): Payer: Self-pay | Admitting: General Surgery

## 2016-07-04 LAB — BASIC METABOLIC PANEL
ANION GAP: 3 — AB (ref 5–15)
BUN: 8 mg/dL (ref 6–20)
CALCIUM: 8.1 mg/dL — AB (ref 8.9–10.3)
CO2: 24 mmol/L (ref 22–32)
Chloride: 108 mmol/L (ref 101–111)
Creatinine, Ser: 0.77 mg/dL (ref 0.44–1.00)
GFR calc Af Amer: >60 mL/min (ref >60)
GLUCOSE: 139 mg/dL — AB (ref 65–99)
Potassium: 4.1 mmol/L (ref 3.5–5.1)
SODIUM: 135 mmol/L (ref 135–145)

## 2016-07-04 LAB — CBC
HCT: 34 % — ABNORMAL LOW (ref 36.0–46.0)
Hemoglobin: 11.7 g/dL — ABNORMAL LOW (ref 12.0–15.0)
MCH: 31.3 pg (ref 26.0–34.0)
MCHC: 34.4 g/dL (ref 30.0–36.0)
MCV: 90.9 fL (ref 78.0–100.0)
PLATELETS: 320 10*3/uL (ref 150–400)
RBC: 3.74 MIL/uL — ABNORMAL LOW (ref 3.87–5.11)
RDW: 12.7 % (ref 11.5–15.5)
WBC: 16 10*3/uL — AB (ref 4.0–10.5)

## 2016-07-04 MED ORDER — ACETAMINOPHEN 500 MG PO TABS: 500.0000 mg | ORAL_TABLET | Freq: Four times a day (QID) | ORAL | Status: DC | PRN

## 2016-07-04 MED ORDER — MORPHINE SULFATE (PF) 2 MG/ML IV SOLN: 2.0000 mg | INTRAVENOUS | Status: DC | PRN

## 2016-07-04 NOTE — Progress Notes (Signed)
[facility] Progress Note  1 Day Post-Op  Subjective: Looks well. Rates abdominal soreness as 7/10. Denies nausea, vomiting, or belching. +flatus. Denies BM. Ambulating to bathroom. Pulling 1200 cc on IS.  Objective: Vital signs in last 24 hours: Temp:  [97.8 F (36.6 C)-98.9 F (37.2 C)] 98.4 F (36.9 C) (11/13 0517) Pulse Rate:  [64-75] 70 (11/13 0517) Resp:  [14-20] 14 (11/13 0517) BP: (116-149)/(75-102) 121/78 (11/13 0517) SpO2:  [97 %-100 %] 100 % (11/13 0517) Last BM Date: 07/09/16  Intake/Output from previous day: 11/12 0701 - 11/13 0700 In: 1826.7 [I.V.:1726.7] Out: 2250 [Urine:2200; Blood:50]  PE: Gen:  Alert, NAD, pleasant Card:  RRR, no M/G/R  Pulm:  CTABL Abd: Soft, appropriately tender, ND, +BS, midline dressing c/d/i Ext:  No erythema, edema, or tenderness  Lab Results:   Recent Labs  07/03/16 1508 07/04/16 0403  WBC 16.4* 16.0*  HGB 12.2 11.7*  HCT 37.4 34.0*  PLT 335 320   BMET  Recent Labs  07/03/16 1508 07/04/16 0403  NA 137 135  K 3.9 4.1  CL 107 108  CO2 25 24  GLUCOSE 109* 139*  BUN 12 8  CREATININE 0.88 0.77  CALCIUM 8.5* 8.1*   CMP     Component Value Date/Time   NA 135 07/04/2016 0403   NA 135 (L) 11/12/2013 0838   K 4.1 07/04/2016 0403   K 4.3 11/12/2013 0838   CL 108 07/04/2016 0403   CL 98 11/12/2013 0838   CO2 24 07/04/2016 0403   CO2 30 11/12/2013 0838   GLUCOSE 139 (H) 07/04/2016 0403   GLUCOSE 160 (H) 11/12/2013 0838   BUN 8 07/04/2016 0403   BUN 17 11/12/2013 0838   CREATININE 0.77 07/04/2016 0403   CREATININE 1.12 11/12/2013 0838   CALCIUM 8.1 (L) 07/04/2016 0403   CALCIUM 8.7 11/12/2013 0838   PROT 8.4 (H) 07/03/2016 1508   PROT 6.5 11/12/2013 0838   ALBUMIN 3.8 07/03/2016 1508   ALBUMIN 2.6 (L) 11/12/2013 0838   AST 17 07/03/2016 1508   AST 34 11/12/2013 0838   ALT 10 (L) 07/03/2016 1508   ALT 48 11/12/2013 0838   ALKPHOS 83 07/03/2016 1508   ALKPHOS 95 11/12/2013 0838   BILITOT 0.8  07/03/2016 1508   BILITOT 0.3 11/12/2013 0838   GFRNONAA >60 07/04/2016 0403   GFRNONAA 59 (L) 11/12/2013 0838   GFRAA >60 07/04/2016 0403   GFRAA >60 11/12/2013 0838   Lipase     Component Value Date/Time   LIPASE 18 07/03/2016 1508   LIPASE 100 02/01/2013 0901   Studies/Results: Ct Abdomen Pelvis W Contrast  Result Date: 07/03/2016 CLINICAL DATA:  Burning periumbilical abdominal pain. Associated nausea. EXAM: CT ABDOMEN AND PELVIS WITH CONTRAST TECHNIQUE: Multidetector CT imaging of the abdomen and pelvis was performed using the standard protocol following bolus administration of intravenous contrast. CONTRAST:  136mL ISOVUE-300 IOPAMIDOL (ISOVUE-300) INJECTION 61% COMPARISON:  09/18/2009 FINDINGS: Lower chest: No acute abnormality. Hepatobiliary: No focal liver abnormality is seen. No gallstones, gallbladder wall thickening, or biliary dilatation. Pancreas: Unremarkable. No pancreatic ductal dilatation or surrounding inflammatory changes. Spleen: Normal in size without focal abnormality. Adrenals/Urinary Tract: Post left nephrectomy. The right kidney demonstrates no evident of nephrolithiasis or hydronephrosis. There is a slightly irregular cystic lesion in the midpole of the right kidney measuring 1.6 x 1.2 by 1.1 cm. Stomach/Bowel: There is a narrow neck periumbilical anterior abdominal wall hernia containing short segment of fluid-filled mildly dilated small bowel loop, with associated mild mesenteric  stranding. There is a mild dilation, which is sub pathologic by CT criteria of the upstream small bowel loops. Postsurgical changes at the GE junction an along the greater curvature of the stomach are seen. Vascular/Lymphatic: No significant vascular findings are present. No enlarged abdominal or pelvic lymph nodes. Reproductive: Uterus and left adnexa are unremarkable. There is a 3.3 cm biphasic complicated cystic structure in the right adnexa. Other: None. Musculoskeletal: Persistent fat  containing left flank hernia. There is a 2.6 cm mixed lytic and sclerotic lesion within the right iliac bone adjacent to the anterior portion of the right sacroiliac joint. IMPRESSION: Narrow neck periumbilical anterior abdominal wall hernia containing short segment of fluid-filled mildly dilated small bowel loop, with mild associated mesenteric stranding, suspicious for incarceration. Mild upstream dilation of small bowel loops, which may be due to ileus or very early small bowel obstruction. Surgical constant is recommended. 1.6 cm irregular right renal cystic lesion. 3.3 cm biphasic complicated right adnexal cystic structure. 2.6 cm mixed lytic and sclerotic lesion within the right iliac bone, adjacent to the anterior portion of the right sacroiliac joint. Given patient's history of prior renal cell carcinoma, PET-CT or contrast-enhanced MRI of the abdomen and pelvis may be considered to further evaluate these findings. These results were called by telephone at the time of interpretation on 07/03/2016 at 4:41 pm to Dr. Milton Ferguson , who verbally acknowledged these results. Electronically Signed   By: Fidela Salisbury M.D.   On: 07/03/2016 16:48   Anti-infectives: Anti-infectives    None     Assessment/Plan S/p  INCARCERATED INCISIONAL HERNIA REPAIR  POD#1  Primary repair, no bowel resection  WBC 16.0  FEN: clear liquids ID: none  VTE: lovenox, SCD's  Dispo: clear liquid diet, await bowel function    LOS: 1 day    Jill Alexanders , [facility] 07/04/2016, 8:41 AM Pager: [phone] Consults: [phone] Mon-Fri 7:00 am-4:30 pm Sat-Sun 7:00 am-11:30 am

## 2016-07-05 LAB — BASIC METABOLIC PANEL
ANION GAP: 4 — AB (ref 5–15)
BUN: 8 mg/dL (ref 6–20)
CHLORIDE: 109 mmol/L (ref 101–111)
CO2: 24 mmol/L (ref 22–32)
Calcium: 8 mg/dL — ABNORMAL LOW (ref 8.9–10.3)
Creatinine, Ser: 0.74 mg/dL (ref 0.44–1.00)
Glucose, Bld: 93 mg/dL (ref 65–99)
POTASSIUM: 3.8 mmol/L (ref 3.5–5.1)
SODIUM: 137 mmol/L (ref 135–145)

## 2016-07-05 LAB — CBC
HCT: 32.2 % — ABNORMAL LOW (ref 36.0–46.0)
HEMOGLOBIN: 10.5 g/dL — AB (ref 12.0–15.0)
MCH: 29.9 pg (ref 26.0–34.0)
MCHC: 32.6 g/dL (ref 30.0–36.0)
MCV: 91.7 fL (ref 78.0–100.0)
Platelets: 281 10*3/uL (ref 150–400)
RBC: 3.51 MIL/uL — AB (ref 3.87–5.11)
RDW: 13 % (ref 11.5–15.5)
WBC: 11.7 10*3/uL — AB (ref 4.0–10.5)

## 2016-07-05 MED ORDER — OXYCODONE-ACETAMINOPHEN 5-325 MG PO TABS: 1.0000 | ORAL_TABLET | ORAL | 0 refills | Status: DC | PRN

## 2016-07-05 MED ORDER — POLYETHYLENE GLYCOL 3350 17 G PO PACK
17.0000 g | PACK | Freq: Every day | ORAL | Status: DC
  Administered 2016-07-05: 17 g via ORAL
  Filled 2016-07-05: qty 1

## 2016-07-05 MED ORDER — SIMETHICONE 80 MG PO CHEW
80.0000 mg | CHEWABLE_TABLET | Freq: Four times a day (QID) | ORAL | Status: DC | PRN
  Administered 2016-07-06: 80 mg via ORAL

## 2016-07-05 NOTE — Progress Notes (Signed)
[facility] Progress Note  2 Days Post-Op  Subjective: Abdominal pain controlled- rated 5/10. Patient reports mild nausea with drinking yesterday, denies vomiting. Passing more flatus and ambulating this AM. Denies BM.   Objective: Vital signs in last 24 hours: Temp:  [98.1 F (36.7 C)-98.6 F (37 C)] 98.3 F (36.8 C) (11/14 0601) Pulse Rate:  [62-68] 62 (11/14 0601) Resp:  [15-16] 16 (11/14 0601) BP: (113-136)/(72-79) 136/73 (11/14 0601) SpO2:  [98 %-100 %] 100 % (11/14 0601) Weight:  [221 lb (100.2 kg)] 221 lb (100.2 kg) (11/14 0737) Last BM Date: 07/03/16  Intake/Output from previous day: 11/13 0701 - 11/14 0700 In: 1739.6 [I.V.:1589.6; IV Piggyback:150] Out: 2150 [Urine:2150] Intake/Output this shift: No intake/output data recorded.  PE: Gen:  Alert, NAD, pleasant Card:  RRR, no M/G/R  Pulm:  CTABL Abd: Soft, obese, appropriately tender, ND, +BS, midline dressing c/d/i Ext:  No erythema, edema, or tenderness Lab Results:   Recent Labs  07/04/16 0403 07/05/16 0442  WBC 16.0* 11.7*  HGB 11.7* 10.5*  HCT 34.0* 32.2*  PLT 320 281   BMET  Recent Labs  07/04/16 0403 07/05/16 0442  NA 135 137  K 4.1 3.8  CL 108 109  CO2 24 24  GLUCOSE 139* 93  BUN 8 8  CREATININE 0.77 0.74  CALCIUM 8.1* 8.0*   PT/INR No results for input(s): LABPROT, INR in the last 72 hours. CMP     Component Value Date/Time   NA 137 07/05/2016 0442   NA 135 (L) 11/12/2013 0838   K 3.8 07/05/2016 0442   K 4.3 11/12/2013 0838   CL 109 07/05/2016 0442   CL 98 11/12/2013 0838   CO2 24 07/05/2016 0442   CO2 30 11/12/2013 0838   GLUCOSE 93 07/05/2016 0442   GLUCOSE 160 (H) 11/12/2013 0838   BUN 8 07/05/2016 0442   BUN 17 11/12/2013 0838   CREATININE 0.74 07/05/2016 0442   CREATININE 1.12 11/12/2013 0838   CALCIUM 8.0 (L) 07/05/2016 0442   CALCIUM 8.7 11/12/2013 0838   PROT 8.4 (H) 07/03/2016 1508   PROT 6.5 11/12/2013 0838   ALBUMIN 3.8 07/03/2016 1508   ALBUMIN  2.6 (L) 11/12/2013 0838   AST 17 07/03/2016 1508   AST 34 11/12/2013 0838   ALT 10 (L) 07/03/2016 1508   ALT 48 11/12/2013 0838   ALKPHOS 83 07/03/2016 1508   ALKPHOS 95 11/12/2013 0838   BILITOT 0.8 07/03/2016 1508   BILITOT 0.3 11/12/2013 0838   GFRNONAA >60 07/05/2016 0442   GFRNONAA 59 (L) 11/12/2013 0838   GFRAA >60 07/05/2016 0442   GFRAA >60 11/12/2013 0838   Lipase     Component Value Date/Time   LIPASE 18 07/03/2016 1508   LIPASE 100 02/01/2013 0901       Studies/Results: Ct Abdomen Pelvis W Contrast  Result Date: 07/03/2016 CLINICAL DATA:  Burning periumbilical abdominal pain. Associated nausea. EXAM: CT ABDOMEN AND PELVIS WITH CONTRAST TECHNIQUE: Multidetector CT imaging of the abdomen and pelvis was performed using the standard protocol following bolus administration of intravenous contrast. CONTRAST:  146mL ISOVUE-300 IOPAMIDOL (ISOVUE-300) INJECTION 61% COMPARISON:  09/18/2009 FINDINGS: Lower chest: No acute abnormality. Hepatobiliary: No focal liver abnormality is seen. No gallstones, gallbladder wall thickening, or biliary dilatation. Pancreas: Unremarkable. No pancreatic ductal dilatation or surrounding inflammatory changes. Spleen: Normal in size without focal abnormality. Adrenals/Urinary Tract: Post left nephrectomy. The right kidney demonstrates no evident of nephrolithiasis or hydronephrosis. There is a slightly irregular cystic lesion in the midpole  of the right kidney measuring 1.6 x 1.2 by 1.1 cm. Stomach/Bowel: There is a narrow neck periumbilical anterior abdominal wall hernia containing short segment of fluid-filled mildly dilated small bowel loop, with associated mild mesenteric stranding. There is a mild dilation, which is sub pathologic by CT criteria of the upstream small bowel loops. Postsurgical changes at the GE junction an along the greater curvature of the stomach are seen. Vascular/Lymphatic: No significant vascular findings are present. No enlarged  abdominal or pelvic lymph nodes. Reproductive: Uterus and left adnexa are unremarkable. There is a 3.3 cm biphasic complicated cystic structure in the right adnexa. Other: None. Musculoskeletal: Persistent fat containing left flank hernia. There is a 2.6 cm mixed lytic and sclerotic lesion within the right iliac bone adjacent to the anterior portion of the right sacroiliac joint. IMPRESSION: Narrow neck periumbilical anterior abdominal wall hernia containing short segment of fluid-filled mildly dilated small bowel loop, with mild associated mesenteric stranding, suspicious for incarceration. Mild upstream dilation of small bowel loops, which may be due to ileus or very early small bowel obstruction. Surgical constant is recommended. 1.6 cm irregular right renal cystic lesion. 3.3 cm biphasic complicated right adnexal cystic structure. 2.6 cm mixed lytic and sclerotic lesion within the right iliac bone, adjacent to the anterior portion of the right sacroiliac joint. Given patient's history of prior renal cell carcinoma, PET-CT or contrast-enhanced MRI of the abdomen and pelvis may be considered to further evaluate these findings. These results were called by telephone at the time of interpretation on 07/03/2016 at 4:41 pm to Dr. Milton Ferguson , who verbally acknowledged these results. Electronically Signed   By: Fidela Salisbury M.D.   On: 07/03/2016 16:48   Anti-infectives: Anti-infectives    None     Assessment/Plan S/p  INCARCERATED INCISIONAL HERNIA REPAIR             POD#2             Primary repair, no bowel resection             WBC 11.7, trending down; VSS  FEN: full liquids ID: none  VTE: lovenox, SCD's  Dispo: advance diet, ambulate Discharge this afternoon vs tomorrow AM  Afternoon re-check @ 1:30 PM - tolerated full liquids, decreased appetite. Pain controlled. +flatus. No BM. Will discuss timing of discharge with MD. Follow-up appointments and discharge info provided.  LOS: 2  days    Jill Alexanders , [facility] 07/05/2016, 9:32 AM Pager: [phone] Consults: [phone] Mon-Fri 7:00 am-4:30 pm Sat-Sun 7:00 am-11:30 am

## 2016-07-05 NOTE — Discharge Instructions (Signed)
[facility]      [facility] [phone]  OPEN ABDOMINAL SURGERY: POST OP INSTRUCTIONS  Always review your discharge instruction sheet given to you by the facility where your surgery was performed.  IF YOU HAVE DISABILITY OR FAMILY LEAVE FORMS, YOU MUST BRING THEM TO THE OFFICE FOR PROCESSING.  PLEASE DO NOT GIVE THEM TO YOUR DOCTOR.  1. A prescription for pain medication may be given to you upon discharge.  Take your pain medication as prescribed, if needed.  If narcotic pain medicine is not needed, then you may take acetaminophen (Tylenol) or ibuprofen (Advil) as needed. 2. Take your usually prescribed medications unless otherwise directed. 3. If you need a refill on your pain medication, please contact your pharmacy. They will contact our office to request authorization.  Prescriptions will not be filled after 5pm or on week-ends. 4. You should follow a light diet the first few days after arrival home, such as soup and crackers, pudding, etc.unless your doctor has advised otherwise. A high-fiber, low fat diet can be resumed as tolerated.   Be sure to include lots of fluids daily. Most patients will experience some swelling and bruising on the chest and neck area.  Ice packs will help.  Swelling and bruising can take several days to resolve 5. Most patients will experience some swelling and bruising in the area of the incision. Ice pack will help. Swelling and bruising can take several days to resolve..  6. It is common to experience some constipation if taking pain medication after surgery.  Increasing fluid intake and taking a stool softener will usually help or prevent this problem from occurring.  A mild laxative (Milk of Magnesia or Miralax) should be taken according to package directions if there are no bowel movements after 48 hours. 7.  You may have steri-strips (small skin tapes) in place directly over the incision.  These strips should be left on the skin for 7-10 days.  If your  surgeon used skin glue on the incision, you may shower in 24 hours.  The glue will flake off over the next 2-3 weeks.  Any sutures or staples will be removed at the office during your follow-up visit. You may find that a light gauze bandage over your incision may keep your staples from being rubbed or pulled. You may shower and replace the bandage daily. 8. ACTIVITIES:  You may resume regular (light) daily activities beginning the next day--such as daily self-care, walking, climbing stairs--gradually increasing activities as tolerated.  You may have sexual intercourse when it is comfortable.  Refrain from any heavy lifting or straining until approved by your doctor. a. You may drive when you no longer are taking prescription pain medication, you can comfortably wear a seatbelt, and you can safely maneuver your car and apply brakes b. Return to Work: ___________________________________ 9. You should see your doctor in the office for a follow-up appointment approximately two weeks after your surgery.  Make sure that you call for this appointment within a day or two after you arrive home to insure a convenient appointment time. OTHER INSTRUCTIONS:  _____________________________________________________________ _____________________________________________________________  WHEN TO CALL YOUR DOCTOR: 1. Fever over 101.0 2. Inability to urinate 3. Nausea and/or vomiting 4. Extreme swelling or bruising 5. Continued bleeding from incision. 6. Increased pain, redness, or drainage from the incision. 7. Difficulty swallowing or breathing 8. Muscle cramping or spasms. 9. Numbness or tingling in hands or feet or around lips.  The clinic staff is available to   answer your questions during regular business hours.  Please don't hesitate to call and ask to speak to one of the nurses if you have concerns.  For further questions, please visit [email or web address]   

## 2016-07-06 ENCOUNTER — Encounter (INDEPENDENT_AMBULATORY_CARE_PROVIDER_SITE_OTHER): Payer: Self-pay | Admitting: General Surgery

## 2016-07-06 MED ORDER — POLYETHYLENE GLYCOL 3350 17 G PO PACK: 17.0000 g | PACK | Freq: Every day | ORAL | 0 refills | Status: AC | PRN

## 2016-07-06 NOTE — Progress Notes (Signed)
Discharge instructions and prescriptions given to patient.

## 2016-07-07 NOTE — Discharge Summary (Signed)
[facility] Discharge Summary   Patient ID: Patricia Harris MRN: [id number] DOB/AGE: 09-12-1965 50 y.o.  Admit date: 07/03/2016 Discharge date: 07/07/2016  Admitting Diagnosis: Incarcerated ventral hernia  Discharge Diagnosis Patient Active Problem List   Diagnosis Date Noted  . Incarcerated ventral hernia 07/03/2016  . Incarcerated incisional hernia 07/03/2016    Consultants None   Imaging: CT ABD PELVIS 07/03/16 - Narrow neck periumbilical anterior abdominal wall hernia containing short segment of fluid-filled mildly dilated small bowel loop, with mild associated mesenteric stranding, suspicious for incarceration. Mild upstream dilation of small bowel loops, which may be due to ileus or very early small bowel obstruction  Procedures Dr. Marland Kitchen Hoxworth (07/03/16) - primary repair of incarcerated incisional hernia  Hospital Course:  50 year-old [place] female with PMH laparoscopic appendectomy (2004) who presented to [facility] ED with periumbilical abdominal pain associated with nausea.  Workup showed a tender abdominal hernia, leukocytosis, and CT findings above.  Patient was admitted and underwent procedure listed above.  Tolerated procedure well and was transferred to the floor.  Diet was advanced as tolerated.  On POD#3, the patient was voiding well, tolerating diet, ambulating well, pain well controlled, vital signs stable, incisions c/d/i and felt stable for discharge home.  Patient will follow up in our office for staple removal in about 2 weeks and  Will follow up with Dr. Excell Seltzer in about 3 weeks. She knows to call with questions or concerns.     Medication List    STOP taking these medications   cyclobenzaprine 10 MG tablet Commonly known as:  FLEXERIL   naproxen 500 MG tablet Commonly known as:  NAPROSYN     TAKE these medications   DAYQUIL PO Take 30 mLs by mouth daily as needed (cold symptoms).   ibuprofen 200 MG tablet Commonly known as:   ADVIL,MOTRIN Take 400 mg by mouth every 6 (six) hours as needed for moderate pain.   oxyCODONE-acetaminophen 5-325 MG tablet Commonly known as:  PERCOCET/ROXICET Take 1-2 tablets by mouth every 4 (four) hours as needed for moderate pain.   polyethylene glycol packet Commonly known as:  MIRALAX / GLYCOLAX Take 17 g by mouth daily as needed.        Follow-up Information    HOXWORTH,BENJAMIN T, MD. Schedule an appointment as soon as possible for a visit in 2 week(s).   Specialty:  General Surgery Why:  for post-operative follow up. Contact information: [address] [place] [address] [phone]        [facility], [place]. Go on 07/18/2016.   Specialty:  General Surgery Why:  staple removal. arrive at 9:30 AM for a 10:00 AM appointment. Contact information: [address] [place] [phone]         Signed: Obie Dredge, [facility] 07/07/2016, 11:43 AM Pager: [phone] Consults: [phone] Mon-Fri 7:00 am-4:30 pm Sat-Sun 7:00 am-11:30 am

## 2017-06-02 ENCOUNTER — Emergency Department (HOSPITAL_BASED_OUTPATIENT_CLINIC_OR_DEPARTMENT_OTHER)
Admission: EM | Admit: 2017-06-02 | Discharge: 2017-06-02 | Disposition: A | Discharge status: Home / Self Care | Payer: BLUE CROSS/BLUE SHIELD | Attending: Emergency Medicine | Admitting: Emergency Medicine

## 2017-06-02 ENCOUNTER — Encounter (HOSPITAL_BASED_OUTPATIENT_CLINIC_OR_DEPARTMENT_OTHER): Payer: Self-pay | Admitting: *Deleted

## 2017-06-02 ENCOUNTER — Emergency Department (HOSPITAL_BASED_OUTPATIENT_CLINIC_OR_DEPARTMENT_OTHER): Payer: BLUE CROSS/BLUE SHIELD

## 2017-06-02 DIAGNOSIS — Z85528 Personal history of other malignant neoplasm of kidney: Secondary | ICD-10-CM | POA: Insufficient documentation

## 2017-06-02 DIAGNOSIS — M545 Low back pain: Secondary | ICD-10-CM | POA: Insufficient documentation

## 2017-06-02 DIAGNOSIS — M25551 Pain in right hip: Secondary | ICD-10-CM | POA: Diagnosis present

## 2017-06-02 LAB — URINALYSIS, ROUTINE W REFLEX MICROSCOPIC
Bilirubin Urine: NEGATIVE
Glucose, UA: NEGATIVE mg/dL
KETONES UR: NEGATIVE mg/dL
NITRITE: NEGATIVE
PH: 6 (ref 5.0–8.0)
PROTEIN: NEGATIVE mg/dL
Specific Gravity, Urine: 1.015 (ref 1.005–1.030)

## 2017-06-02 LAB — URINALYSIS, MICROSCOPIC (REFLEX): RBC / HPF: NONE SEEN RBC/hpf (ref 0–5)

## 2017-06-02 LAB — PREGNANCY, URINE: PREG TEST UR: NEGATIVE

## 2017-06-02 MED ORDER — ACETAMINOPHEN 325 MG PO TABS
650.0000 mg | ORAL_TABLET | Freq: Once | ORAL | Status: AC
  Administered 2017-06-02: 650 mg via ORAL
  Filled 2017-06-02: qty 2

## 2017-06-02 NOTE — ED Triage Notes (Signed)
Pt c/o right hip pain x 2 weeks

## 2017-06-02 NOTE — ED Provider Notes (Addendum)
[place] [facility]PT [facility] Provider Note   CSN: [id number] Arrival date & time: 06/02/17  1642     History   Chief Complaint Chief Complaint  Patient presents with  . Hip Pain    HPI Patricia Harris is a 51 y.o. female.  HPI  51 year old female presents with 2 weeks of right low back pain/buttock pain and hip pain. Started off where it was a consistent nagging an aching sensation. It was present 24/7 but was more prominent when she was resting. However over the last 2448 hrs. the pain seems to be worsening. She took some Aleve a few hours ago and this seems to have somewhat helped. She does not burn any injuries. She has not no cine abdominal pain, hematuria, or dysuria. No weakness or numbness or bowel or bladder incontinence. Over the last 24 the pain is also seem to radiate into her right groin/proximal thigh.  Past Medical History:  Diagnosis Date  . 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 History of kidney cancer    LEFT  . Pre-diabetes     Patient Active Problem List   Diagnosis Date Noted  . Incarcerated ventral hernia 07/03/2016  . Incarcerated incisional hernia 07/03/2016    Past Surgical History:  Procedure Laterality Date  . APPENDECTOMY    . KIDNEY SURGERY  2004   LEFT KIDNEY REMOVED   . UMBILICAL HERNIA REPAIR N/A 07/03/2016   Procedure: INCARCERATED INCISIONAL HERNIA REPAIR;  Surgeon: Excell Seltzer, MD;  Location: [facility]S;  Service: General;  Laterality: N/A;    OB History    No data available       Home Medications    Prior to Admission medications   Medication Sig Start Date End Date Taking? Authorizing Provider  ibuprofen (ADVIL,MOTRIN) 200 MG tablet Take 400 mg by mouth every 6 (six) hours as needed for moderate pain.    [provider]  polyethylene glycol (MIRALAX / GLYCOLAX) packet Take 17 g by mouth daily as needed. 07/06/16   Jill Alexanders, PA-C  Pseudoephedrine-APAP-DM (DAYQUIL PO) Take 30 mLs by mouth daily as needed  (cold symptoms).    [provider]    Family History History reviewed. No pertinent family history.  Social History Social History  Substance Use Topics  . Smoking status: Never Smoker  . Smokeless tobacco: Never Used  . Alcohol use No     Allergies   Patient has no known allergies.   Review of Systems Review of Systems  Gastrointestinal: Negative for abdominal pain and vomiting.  Genitourinary: Negative for dysuria and hematuria.  Musculoskeletal: Positive for arthralgias and back pain.  Neurological: Negative for weakness and numbness.  All other systems reviewed and are negative.    Physical Exam Updated Vital Signs BP (!) 135/91 (BP Location: Right Arm)   Pulse (!) 57   Temp 97.7 F (36.5 C) (Oral)   Resp 16   Ht 5\' 4"  (1.626 m)   Wt 102.5 kg (226 lb)   LMP  (LMP Unknown)   SpO2 99%   BMI 38.79 kg/m   Physical Exam  Constitutional: She is oriented to person, place, and time. She appears well-developed and well-nourished.  obese  HENT:  Head: Normocephalic and atraumatic.  Right Ear: External ear normal.  Left Ear: External ear normal.  Nose: Nose normal.  Eyes: Right eye exhibits no discharge. Left eye exhibits no discharge.  Cardiovascular: Normal rate and regular rhythm.   Pulses:  Dorsalis pedis pulses are 2+ on the right side.  Pulmonary/Chest: Effort normal.  Abdominal: Soft. She exhibits no distension. There is no tenderness.  Musculoskeletal:       Right hip: She exhibits tenderness. She exhibits normal range of motion.       Lumbar back: She exhibits no bony tenderness.       Legs: Tenderness is most prominent over right posterior iliac crest. Pain worsens with ROM of right hip or with straight leg raise of right leg  Neurological: She is alert and oriented to person, place, and time.  5/5 strength in BLE. Normal gross sensation  Skin: Skin is warm and dry.  Nursing note and vitals reviewed.    ED Treatments / Results   Labs (all labs ordered are listed, but only abnormal results are displayed) Labs Reviewed  URINALYSIS, ROUTINE W REFLEX MICROSCOPIC - Abnormal; Notable for the following:       Result Value   Hgb urine dipstick SMALL (*)    Leukocytes, UA TRACE (*)    All other components within normal limits  URINALYSIS, MICROSCOPIC (REFLEX) - Abnormal; Notable for the following:    Bacteria, UA RARE (*)    Squamous Epithelial / LPF 0-5 (*)    All other components within normal limits  PREGNANCY, URINE    EKG  EKG Interpretation None       Radiology Dg Hip Unilat With Pelvis 2-3 Views Right  Result Date: 06/02/2017 CLINICAL DATA:  Hip pain for 2-3 weeks.  No known injury. EXAM: DG HIP (WITH OR WITHOUT PELVIS) 2-3V RIGHT COMPARISON:  None. FINDINGS: There is no evidence of hip fracture or dislocation. There is no evidence of arthropathy or other focal bone abnormality. Surgical clips noted overlying the left mid abdomen. IMPRESSION: Negative. Electronically Signed   By: Fidela Salisbury M.D.   On: 06/02/2017 18:12    Procedures Procedures (including critical care time)  Medications Ordered in ED Medications  acetaminophen (TYLENOL) tablet 650 mg (650 mg Oral Given 06/02/17 1736)     Initial Impression / Assessment and Plan / ED Course  I have reviewed the triage vital signs and the nursing notes.  Pertinent labs & imaging results that were available during my care of the patient were reviewed by me and considered in my medical decision making (see chart for details).     Patient's pain is likely muscular, possibly a pinch nerve. However she is neurovascularly intact. No fractures seen. The pain is mostly buttocks over her pelvis. While does seem to radiate across to her groin, I think renal colic or AAA is highly unlikely. I think continuing NSAIDs and adding on Tylenol plus heat on the sore spot is most prevalent. Follow-up with PCP if no improvement. Discussed return  precautions.  Final Clinical Impressions(s) / ED Diagnoses   Final diagnoses:  Right hip pain    New Prescriptions Discharge Medication List as of 06/02/2017  6:25 PM       Sherwood Gambler, MD 06/02/17 [name]    Sherwood Gambler, MD 06/02/17 1905

## 2019-06-18 ENCOUNTER — Other Ambulatory Visit: Payer: Self-pay | Admitting: Internal Medicine

## 2019-06-18 DIAGNOSIS — Z1231 Encounter for screening mammogram for malignant neoplasm of breast: Secondary | ICD-10-CM

## 2019-10-22 ENCOUNTER — Ambulatory Visit: Payer: Managed Care, Other (non HMO) | Attending: Internal Medicine

## 2020-02-11 ENCOUNTER — Ambulatory Visit
Admission: RE | Admit: 2020-02-11 | Discharge: 2020-02-11 | Disposition: A | Discharge status: Home / Self Care | Payer: PRIVATE HEALTH INSURANCE | Source: Ambulatory Visit | Attending: Internal Medicine | Admitting: Internal Medicine

## 2020-02-11 ENCOUNTER — Other Ambulatory Visit: Payer: Self-pay

## 2020-02-11 DIAGNOSIS — Z1231 Encounter for screening mammogram for malignant neoplasm of breast: Secondary | ICD-10-CM | POA: Insufficient documentation

## 2020-02-21 ENCOUNTER — Inpatient Hospital Stay
Admission: RE | Admit: 2020-02-21 | Discharge: 2020-02-21 | Disposition: A | Discharge status: Home / Self Care | Payer: Self-pay | Source: Ambulatory Visit | Attending: *Deleted | Admitting: *Deleted

## 2020-02-21 ENCOUNTER — Other Ambulatory Visit: Payer: Self-pay | Admitting: *Deleted

## 2020-02-21 DIAGNOSIS — Z1231 Encounter for screening mammogram for malignant neoplasm of breast: Secondary | ICD-10-CM

## 2020-03-24 ENCOUNTER — Ambulatory Visit (INDEPENDENT_AMBULATORY_CARE_PROVIDER_SITE_OTHER): Payer: No Typology Code available for payment source | Admitting: Otolaryngology

## 2020-03-24 ENCOUNTER — Other Ambulatory Visit: Payer: Self-pay

## 2020-03-24 ENCOUNTER — Encounter (INDEPENDENT_AMBULATORY_CARE_PROVIDER_SITE_OTHER): Payer: Self-pay | Admitting: Otolaryngology

## 2020-03-24 VITALS — Temp 97.3°F

## 2020-03-24 DIAGNOSIS — H608X3 Other otitis externa, bilateral: Secondary | ICD-10-CM

## 2020-03-24 NOTE — Progress Notes (Signed)
HPI: Patricia Harris is a 54 y.o. female who presents for evaluation of itchy ears.  She has had this for several months.  She tried a steroid cream that she used on her skin that seemed to help with itching in the ears.  She has had no hearing problems and no drainage from the ears.  Past Medical History:  Diagnosis Date  . 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 History of kidney cancer    LEFT  . Pre-diabetes    Past Surgical History:  Procedure Laterality Date  . APPENDECTOMY    . KIDNEY SURGERY  2004   LEFT KIDNEY REMOVED   . UMBILICAL HERNIA REPAIR N/A 07/03/2016   Procedure: INCARCERATED INCISIONAL HERNIA REPAIR;  Surgeon: Excell Seltzer, MD;  Location: [facility];  Service: General;  Laterality: N/A;   Social History   Socioeconomic History  . Marital status: Divorced    Spouse name: Not on file  . Number of children: Not on file  . Years of education: Not on file  . Highest education level: Not on file  Occupational History  . Not on file  Tobacco Use  . Smoking status: Never Smoker  . Smokeless tobacco: Never Used  Substance and Sexual Activity  . Alcohol use: No  . Drug use: No  . Sexual activity: Never    Birth control/protection: None  Other Topics Concern  . Not on file  Social History Narrative  . Not on file   Social Determinants of Health   Financial Resource Strain:   . Difficulty of Paying Living Expenses:   Food Insecurity:   . Worried About Charity fundraiser in the Last Year:   . Arboriculturist in the Last Year:   Transportation Needs:   . Film/video editor (Medical):   Marland Kitchen Lack of Transportation (Non-Medical):   Physical Activity:   . Days of Exercise per Week:   . Minutes of Exercise per Session:   Stress:   . Feeling of Stress :   Social Connections:   . Frequency of Communication with Friends and Family:   . Frequency of Social Gatherings with Friends and Family:   . Attends Religious Services:   . Active Member of Clubs or  Organizations:   . Attends Archivist Meetings:   Marland Kitchen Marital Status:    Family History  Problem Relation Age of Onset  . Breast cancer Paternal Grandmother    No Known Allergies Prior to Admission medications   Medication Sig Start Date End Date Taking? Authorizing Provider  ibuprofen (ADVIL,MOTRIN) 200 MG tablet Take 400 mg by mouth every 6 (six) hours as needed for moderate pain.    [provider]  polyethylene glycol (MIRALAX / GLYCOLAX) packet Take 17 g by mouth daily as needed. 07/06/16   Jill Alexanders, PA-C  Pseudoephedrine-APAP-DM (DAYQUIL PO) Take 30 mLs by mouth daily as needed (cold symptoms).    [provider]     Positive ROS: Otherwise negative  All other systems have been reviewed and were otherwise negative with the exception of those mentioned in the HPI and as above.  Physical Exam: Constitutional: Alert, well-appearing, no acute distress Ears: External ears without lesions or tenderness. Ear canals are clear bilaterally.  TMs are clear bilaterally.  The area of itching is the outer portion of the ear canal. Nasal: External nose without lesions. Clear nasal passages Oral: Lips and gums without lesions. Tongue and palate mucosa without  lesions. Posterior oropharynx clear. Neck: No palpable adenopathy or masses Respiratory: Breathing comfortably  Skin: No facial/neck lesions or rash noted.  Procedures  Assessment: Chronic itching of ear canals  Plan: Prescribed Diprolene 0.05% cream or lotion to apply to the ear twice daily for 4 to 5 days as needed itching. She will follow-up as needed.  Radene Journey, MD

## 2021-02-07 ENCOUNTER — Emergency Department: Payer: BC Managed Care – PPO

## 2021-02-07 ENCOUNTER — Encounter: Payer: Self-pay | Admitting: Emergency Medicine

## 2021-02-07 ENCOUNTER — Other Ambulatory Visit: Payer: Self-pay

## 2021-02-07 ENCOUNTER — Emergency Department
Admission: EM | Admit: 2021-02-07 | Discharge: 2021-02-08 | Disposition: A | Discharge status: Home / Self Care | Payer: BC Managed Care – PPO | Attending: Emergency Medicine | Admitting: Emergency Medicine

## 2021-02-07 DIAGNOSIS — D352 Benign neoplasm of pituitary gland: Secondary | ICD-10-CM

## 2021-02-07 DIAGNOSIS — Z85528 Personal history of other malignant neoplasm of kidney: Secondary | ICD-10-CM | POA: Diagnosis not present

## 2021-02-07 DIAGNOSIS — R519 Headache, unspecified: Secondary | ICD-10-CM | POA: Insufficient documentation

## 2021-02-07 DIAGNOSIS — I10 Essential (primary) hypertension: Secondary | ICD-10-CM | POA: Insufficient documentation

## 2021-02-07 HISTORY — DX: Essential (primary) hypertension: I10

## 2021-02-07 LAB — CBC
HCT: 35.5 % — ABNORMAL LOW (ref 36.0–46.0)
Hemoglobin: 11.7 g/dL — ABNORMAL LOW (ref 12.0–15.0)
MCH: 30.6 pg (ref 26.0–34.0)
MCHC: 33 g/dL (ref 30.0–36.0)
MCV: 92.9 fL (ref 80.0–100.0)
Platelets: 332 10*3/uL (ref 150–400)
RBC: 3.82 MIL/uL — ABNORMAL LOW (ref 3.87–5.11)
RDW: 12.6 % (ref 11.5–15.5)
WBC: 11.6 10*3/uL — ABNORMAL HIGH (ref 4.0–10.5)
nRBC: 0 % (ref 0.0–0.2)

## 2021-02-07 LAB — BASIC METABOLIC PANEL
Anion gap: 6 (ref 5–15)
BUN: 19 mg/dL (ref 6–20)
CO2: 25 mmol/L (ref 22–32)
Calcium: 8.6 mg/dL — ABNORMAL LOW (ref 8.9–10.3)
Chloride: 105 mmol/L (ref 98–111)
Creatinine, Ser: 0.92 mg/dL (ref 0.44–1.00)
GFR, Estimated: >60 mL/min (ref >60)
Glucose, Bld: 116 mg/dL — ABNORMAL HIGH (ref 70–99)
Potassium: 3.8 mmol/L (ref 3.5–5.1)
Sodium: 136 mmol/L (ref 135–145)

## 2021-02-07 LAB — POC URINE PREG, ED: Preg Test, Ur: NEGATIVE

## 2021-02-07 LAB — TROPONIN I (HIGH SENSITIVITY)
Troponin I (High Sensitivity): 4 ng/L (ref <18)
Troponin I (High Sensitivity): 5 ng/L (ref <18)

## 2021-02-07 MED ORDER — ACETAMINOPHEN 500 MG PO TABS
ORAL_TABLET | ORAL | Status: AC
  Filled 2021-02-07: qty 2

## 2021-02-07 MED ORDER — ACETAMINOPHEN 500 MG PO TABS
1000.0000 mg | ORAL_TABLET | Freq: Once | ORAL | Status: AC
  Administered 2021-02-07: 20:00:00 1000 mg via ORAL

## 2021-02-07 MED ORDER — PROCHLORPERAZINE EDISYLATE 10 MG/2ML IJ SOLN
10.0000 mg | Freq: Once | INTRAMUSCULAR | Status: AC
  Administered 2021-02-07: 22:00:00 10 mg via INTRAVENOUS
  Filled 2021-02-07: qty 2

## 2021-02-07 MED ORDER — LORAZEPAM 2 MG/ML IJ SOLN
1.0000 mg | Freq: Once | INTRAMUSCULAR | Status: AC
  Administered 2021-02-08: 1 mg via INTRAVENOUS
  Filled 2021-02-07: qty 1

## 2021-02-07 MED ORDER — DIPHENHYDRAMINE HCL 50 MG/ML IJ SOLN
25.0000 mg | Freq: Once | INTRAMUSCULAR | Status: AC
  Administered 2021-02-07: 22:00:00 25 mg via INTRAVENOUS
  Filled 2021-02-07: qty 1

## 2021-02-07 NOTE — ED Triage Notes (Signed)
Pt via POV from home. Pt c/o headache for 1 week and HTN, pt is on BP meds. Pt states that her last BP was 188/114. Pt also c/o dizziness. Denies NV. Denies light photosensitivity. Pt is A&Ox4 and NAD.

## 2021-02-07 NOTE — ED Provider Notes (Signed)
[facility] Emergency Department Provider Note  ____________________________________________   Event Date/Time   First MD Initiated Contact with Patient 02/07/21 2117     (approximate)  I have reviewed the triage vital signs and the nursing notes.   HISTORY  Chief Complaint Hypertension and Headache    HPI Patricia Harris is a 55 y.o. female with history of hypertension, prior kidney cancer, here with headache.  The patient states that for the last 2 weeks, she has had near daily, constant, primarily left-sided but also generalized headaches.  She does not normally get headaches.  She did have COVID approximately 3 weeks ago before the onset of her symptoms.  She has also had some consistently elevating blood pressures at home.  She was normally in the 140s over 90s but has been as high as 180s over 100s to 110s.  The headache is generalized, but also particularly worse on the left.  Denies any vision changes.  No focal numbness or weakness.  No trauma.  No specific alleviating factors other than Tylenol or Advil, but her headache then returns.    Past Medical History:  Diagnosis Date   Cancer [facility])    LEFT NEPHRECTOMY    History of kidney cancer    LEFT   Hypertension    Pre-diabetes     Patient Active Problem List   Diagnosis Date Noted   Incarcerated ventral hernia 07/03/2016   Incarcerated incisional hernia 07/03/2016    Past Surgical History:  Procedure Laterality Date   APPENDECTOMY     KIDNEY SURGERY  2004   LEFT KIDNEY REMOVED    UMBILICAL HERNIA REPAIR N/A 07/03/2016   Procedure: INCARCERATED INCISIONAL HERNIA REPAIR;  Surgeon: Excell Seltzer, MD;  Location: [facility] OR[facility];  Service: General;  Laterality: N/A;    Prior to Admission medications   Medication Sig Start Date End Date Taking? Authorizing Provider  ibuprofen (ADVIL,MOTRIN) 200 MG tablet Take 400 mg by mouth every 6 (six) hours as needed for moderate pain.    [provider]  polyethylene glycol (MIRALAX / GLYCOLAX) packet Take 17 g by mouth daily as needed. 07/06/16   Jill Alexanders, PA-C  Pseudoephedrine-APAP-DM (DAYQUIL PO) Take 30 mLs by mouth daily as needed (cold symptoms).    [provider]    Allergies Patient has no known allergies.  Family History  Problem Relation Age of Onset   Breast cancer Paternal Grandmother     Social History Social History   Tobacco Use   Smoking status: Never   Smokeless tobacco: Never  Substance Use Topics   Alcohol use: No   Drug use: No    Review of Systems  Review of Systems  Constitutional:  Negative for fatigue and fever.  HENT:  Negative for congestion and sore throat.   Eyes:  Negative for visual disturbance.  Respiratory:  Negative for cough and shortness of breath.   Cardiovascular:  Negative for chest pain.  Gastrointestinal:  Negative for abdominal pain, diarrhea, nausea and vomiting.  Genitourinary:  Negative for flank pain.  Musculoskeletal:  Negative for back pain and neck pain.  Skin:  Negative for rash and wound.  Neurological:  Positive for headaches. Negative for weakness.    ____________________________________________  PHYSICAL EXAM:      VITAL SIGNS: ED Triage Vitals  Enc Vitals Group     BP 02/07/21 1712 (!) 182/108     Pulse Rate 02/07/21 1712 72     Resp 02/07/21 1712 20  Temp 02/07/21 1712 98.4 F (36.9 C)     Temp Source 02/07/21 1712 Oral     SpO2 02/07/21 1712 100 %     Weight 02/07/21 1714 232 lb (105.2 kg)     Height 02/07/21 1714 5\' 4"  (1.626 m)     Head Circumference --      Peak Flow --      Pain Score 02/07/21 1714 7     Pain Loc --      Pain Edu? --      Excl. in [place]? --      Physical Exam Vitals and nursing note reviewed.  Constitutional:      General: She is not in acute distress.    Appearance: She is well-developed.  HENT:     Head: Normocephalic and atraumatic.  Eyes:     Conjunctiva/sclera: Conjunctivae  normal.  Cardiovascular:     Rate and Rhythm: Normal rate and regular rhythm.     Heart sounds: Normal heart sounds. No murmur heard.   No friction rub.  Pulmonary:     Effort: Pulmonary effort is normal. No respiratory distress.     Breath sounds: Normal breath sounds. No wheezing or rales.  Abdominal:     General: There is no distension.     Palpations: Abdomen is soft.     Tenderness: There is no abdominal tenderness.  Musculoskeletal:     Cervical back: Neck supple.  Skin:    General: Skin is warm.     Capillary Refill: Capillary refill takes less than 2 seconds.  Neurological:     Mental Status: She is alert and oriented to person, place, and time.     GCS: GCS eye subscore is 4. GCS verbal subscore is 5. GCS motor subscore is 6.     Motor: No abnormal muscle tone.     Comments: Cranial nerves II through XII intact.  Strength out of 5 bilateral upper and lower extremities.  Normal sensation to light touch.      ____________________________________________   LABS (all labs ordered are listed, but only abnormal results are displayed)  Labs Reviewed  CBC - Abnormal; Notable for the following components:      Result Value   WBC 11.6 (*)    RBC 3.82 (*)    Hemoglobin 11.7 (*)    HCT 35.5 (*)    All other components within normal limits  BASIC METABOLIC PANEL - Abnormal; Notable for the following components:   Glucose, Bld 116 (*)    Calcium 8.6 (*)    All other components within normal limits  POC URINE PREG, ED - Normal  TROPONIN I (HIGH SENSITIVITY)  TROPONIN I (HIGH SENSITIVITY)    ____________________________________________  EKG: Normal sinus rhythm, ventricular rate 63.  PR 156, QRS 70, QTc 427.  Nonspecific T wave changes, no acute ST elevations or depressions. ________________________________________  RADIOLOGY All imaging, including plain films, CT scans, and ultrasounds, independently reviewed by me, and interpretations confirmed via formal radiology  reads.  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  Classic migraine headache. EXAM: CT HEAD WITHOUT CONTRAST TECHNIQUE: Contiguous axial images were obtained from the base of the skull through the vertex without intravenous contrast. COMPARISON:  None. FINDINGS: Brain: Ventricle size normal.  Negative for hemorrhage or infarct Enlargement of the sella. Pituitary lesion fills  the sella measuring approximately 14 mm in size. Vascular: Negative for hyperdense vessel Skull: Negative Sinuses/Orbits: Air-fluid level left sphenoid sinus.  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 Recommend MRI of the brain without and with contrast for further evaluation. Electronically Signed   By: Franchot Gallo M.D.   On: 02/07/2021 17:38    ____________________________________________  PROCEDURES   Procedure(s) performed (including Critical Care):  Procedures  ____________________________________________  INITIAL IMPRESSION / MDM / [place] / ED COURSE  As part of my medical decision making, I reviewed the following data within the [place] notes reviewed and incorporated, Old chart reviewed, Notes from prior ED visits, and [place] Controlled Substance Database       *Darlette Dubow was evaluated in Emergency Department on 02/07/2021 for the symptoms described in the history of present illness. She was evaluated in the context of the global COVID-19 pandemic, which necessitated consideration that the patient might be at risk for infection with the SARS-CoV-2 virus that causes COVID-19. Institutional protocols and algorithms that pertain to the evaluation of patients at risk for COVID-19 are in a state of rapid change based on information released  by regulatory bodies including the CDC and federal and state organizations. These policies and algorithms were followed during the patient's care in the ED.  Some ED evaluations and interventions may be delayed as a result of limited staffing during the pandemic.*     Medical Decision Making: 55 year old female here with new onset headache and hypertension.  Clinically, suspect headache related to recent COVID infection with secondary hypertension due to discomfort.  Differential includes hypertensive encephalopathy.  CT head obtained, does show 14 mm pituitary lesion.  Unclear if this is related or incidental.  She denies any vision changes.  Visual fields are intact on my exam.  That being said, given new headaches with CT findings, will obtain MRI.  Will give headache control to see if it improves blood pressure.  If not, will increase her antihypertensives.  Plan to f/u MRI. If she remains hypertensive, would consider increasing her home meds. She is on losartan 50 mg qd and HCTZ 12.5 mg qd per review of care everywhere notes. Signed out to Dr. Beather Arbour. ____________________________________________  FINAL CLINICAL IMPRESSION(S) / ED DIAGNOSES  Final diagnoses:  Acute nonintractable headache, unspecified headache type  Hypertension, unspecified type     MEDICATIONS GIVEN DURING THIS VISIT:  Medications  LORazepam (ATIVAN) injection 1 mg (has no administration in time range)  acetaminophen (TYLENOL) tablet 1,000 mg (1,000 mg Oral Given 02/07/21 1954)  prochlorperazine (COMPAZINE) injection 10 mg (10 mg Intravenous Given 02/07/21 2215)  diphenhydrAMINE (BENADRYL) injection 25 mg (25 mg Intravenous Given 02/07/21 2214)     ED Discharge Orders     None        Note:  This document was prepared using Dragon voice recognition software and may include unintentional dictation errors.   Duffy Bruce, MD 02/07/21 [phone]

## 2021-02-08 ENCOUNTER — Emergency Department: Payer: BC Managed Care – PPO

## 2021-02-08 LAB — TSH: TSH: 5.628 u[IU]/mL — ABNORMAL HIGH (ref 0.350–4.500)

## 2021-02-08 LAB — T4, FREE: Free T4: 0.98 ng/dL (ref 0.61–1.12)

## 2021-02-08 MED ORDER — HYDROCHLOROTHIAZIDE 25 MG PO TABS: 25.0000 mg | ORAL_TABLET | Freq: Every day | ORAL | 0 refills | Status: DC

## 2021-02-08 MED ORDER — GADOBUTROL 1 MMOL/ML IV SOLN
9.0000 mL | Freq: Once | INTRAVENOUS | Status: AC | PRN
  Administered 2021-02-08: 10 mL via INTRAVENOUS

## 2021-02-08 NOTE — ED Provider Notes (Signed)
-----------------------------------------   3:31 AM on 02/08/2021 -----------------------------------------  MRI brain interpreted per Dr. Collins Scotland:  1. Intra sellar mass measuring 2.2 x 1.7 x 1.2 cm with grade 3  invasion of the right cavernous sinus. This is most consistent with  a pituitary macroadenoma. However, dynamic pituitary postcontrast  imaging was not performed.  2. No acute intracranial abnormality.   Updated patient on MRI result.  Neurological exam reveals no cranial nerve deficits, especially no deficits of CN3.  Patient denies vision changes, nausea/vomiting.  I discussed MRI results with Dr. Lacinda Axon from neurosurgery who recommends adding cortisol, IGF-1, prolactin and thyroid panels.  Patient may be followed up as an outpatient.  Updated patient on MRI result and plan of care.  Patient states she is currently taking losartan 100 mg daily, HCTZ 12.5 mg daily.  Will increase HCTZ to 25 mg daily.  Strict return precautions given.  Patient verbalizes understanding and agrees with plan of care.   Paulette Blanch, MD 02/08/21 [phone]

## 2021-02-08 NOTE — Discharge Instructions (Addendum)
1.  Increase HCTZ to 25 mg daily. 2.  You may take Tylenol as needed for headache. 3.  Return to the ER for worsening symptoms, persistent vomiting, lethargy or other concerns.

## 2021-02-09 LAB — ACTH: C206 ACTH: 35.2 pg/mL (ref 7.2–63.3)

## 2021-08-11 ENCOUNTER — Ambulatory Visit
Admission: EM | Admit: 2021-08-11 | Discharge: 2021-08-11 | Disposition: A | Discharge status: Home / Self Care | Payer: No Typology Code available for payment source | Attending: Internal Medicine | Admitting: Internal Medicine

## 2021-08-11 ENCOUNTER — Other Ambulatory Visit: Payer: Self-pay

## 2021-08-11 DIAGNOSIS — G500 Trigeminal neuralgia: Secondary | ICD-10-CM

## 2021-08-11 MED ORDER — AMOXICILLIN-POT CLAVULANATE 875-125 MG PO TABS: 1.0000 | ORAL_TABLET | Freq: Two times a day (BID) | ORAL | 0 refills | Status: DC

## 2021-08-11 NOTE — ED Triage Notes (Signed)
Pt reports having a dental extraction two weeks ago (R side).  Pain slightly faded but then flared up again.  Describes constant but sometimes worsening facial pain.  R eye has started to drain.  Unable to tolerate hot/cold in mouth, has to keep face covered when leaving house to minimize pain. Has returned to dentist but was told everything is fine. No drainage from extraction site.  Last took Motrin 0830.

## 2021-08-11 NOTE — Discharge Instructions (Addendum)
Pain on the right side of your face is likely related to the dental procedure Completed a course of antibiotics If the pain is persistent you will need to be reevaluated again at some point Pain we will get better over the course of a few weeks or longer. Follow-up with your primary care physician

## 2021-08-11 NOTE — ED Provider Notes (Signed)
[facility]    CSN: [id number] Arrival date & time: 08/11/21  1031      History   Chief Complaint Chief Complaint  Patient presents with   Facial Pain    HPI Patricia Harris is a 55 y.o. female comes to the urgent care with right-sided facial pain which started about 2 weeks ago.  Patient had a dental extraction of the right mandibular premolar tooth about 2-1/2 weeks ago.  Following the procedure patient had pain in the right side of the face.  Tylenol and Motrin use helped with the severity of the symptoms.  Over the past couple of weeks patient has noticed worsening of the pain.  Right-sided facial pain is sharp and lancinating in nature.  Pain radiates into the right ear.  No swelling on the right side of the face.  No fever or chills.  No gum swelling.  Area where tooth was extracted is sensitive..  Patient followed up with the dentist who after receiving and negative x-ray results recommended patient follow-up with urgent care. HPI  Past Medical History:  Diagnosis Date   Cancer ([place])    LEFT NEPHRECTOMY    History of kidney cancer    LEFT   Hypertension    Pre-diabetes     Patient Active Problem List   Diagnosis Date Noted   Incarcerated ventral hernia 07/03/2016   Incarcerated incisional hernia 07/03/2016    Past Surgical History:  Procedure Laterality Date   APPENDECTOMY     KIDNEY SURGERY  2004   LEFT KIDNEY REMOVED    UMBILICAL HERNIA REPAIR N/A 07/03/2016   Procedure: INCARCERATED INCISIONAL HERNIA REPAIR;  Surgeon: Excell Seltzer, MD;  Location: [facility];  Service: General;  Laterality: N/A;    OB History   No obstetric history on file.      Home Medications    Prior to Admission medications   Medication Sig Start Date End Date Taking? Authorizing Provider  amoxicillin-clavulanate (AUGMENTIN) 875-125 MG tablet Take 1 tablet by mouth every 12 (twelve) hours. 08/11/21  Yes Kennan Detter, Myrene Galas, MD  hydrochlorothiazide (HYDRODIURIL) 25  MG tablet Take 1 tablet (25 mg total) by mouth daily. 02/08/21  Yes Paulette Blanch, MD  ibuprofen (ADVIL,MOTRIN) 200 MG tablet Take 400 mg by mouth every 6 (six) hours as needed for moderate pain.   Yes [provider]  losartan (COZAAR) 100 MG tablet Take 100 mg by mouth daily.   Yes [provider]  polyethylene glycol (MIRALAX / GLYCOLAX) packet Take 17 g by mouth daily as needed. 07/06/16  Yes Simaan, Darci Current, PA-C  Pseudoephedrine-APAP-DM (DAYQUIL PO) Take 30 mLs by mouth daily as needed (cold symptoms).    [provider]    Family History Family History  Problem Relation Age of Onset   Breast cancer Paternal Grandmother     Social History Social History   Tobacco Use   Smoking status: Never   Smokeless tobacco: Never  Substance Use Topics   Alcohol use: No   Drug use: No     Allergies   Patient has no known allergies.   Review of Systems Review of Systems  HENT:  Positive for dental problem and ear pain. Negative for facial swelling, hearing loss, mouth sores, rhinorrhea, sinus pressure and sinus pain.   Respiratory: Negative.    Cardiovascular: Negative.     Physical Exam Triage Vital Signs ED Triage Vitals  Enc Vitals Group     BP 08/11/21 1111 (!) 173/119  Pulse Rate 08/11/21 1111 69     Resp 08/11/21 1111 16     Temp 08/11/21 1111 98.6 F (37 C)     Temp Source 08/11/21 1111 Oral     SpO2 08/11/21 1111 96 %     Weight --      Height --      Head Circumference --      Peak Flow --      Pain Score 08/11/21 1113 4     Pain Loc --      Pain Edu? --      Excl. in [place]? --    No data found.  Updated Vital Signs BP (!) 173/119 (BP Location: Right Arm)    Pulse 69    Temp 98.6 F (37 C) (Oral)    Resp 16    LMP  (LMP Unknown)    SpO2 96%   Visual Acuity Right Eye Distance:   Left Eye Distance:   Bilateral Distance:    Right Eye Near:   Left Eye Near:    Bilateral Near:     Physical Exam Vitals and nursing note  reviewed.  Constitutional:      Appearance: She is not ill-appearing.  HENT:     Right Ear: Tympanic membrane normal.     Left Ear: Tympanic membrane normal.  Eyes:     Extraocular Movements: Extraocular movements intact.     Pupils: Pupils are equal, round, and reactive to light.  Cardiovascular:     Rate and Rhythm: Normal rate and regular rhythm.  Pulmonary:     Effort: Pulmonary effort is normal.     Breath sounds: Normal breath sounds.  Neurological:     Mental Status: She is alert.     UC Treatments / Results  Labs (all labs ordered are listed, but only abnormal results are displayed) Labs Reviewed - No data to display  EKG   Radiology No results found.  Procedures Procedures (including critical care time)  Medications Ordered in UC Medications - No data to display  Initial Impression / Assessment and Plan / UC Course  I have reviewed the triage vital signs and the nursing notes.  Pertinent labs & imaging results that were available during my care of the patient were reviewed by me and considered in my medical decision making (see chart for details).     1.  Trigeminal neuralgia following a tooth extraction: Patient is prescribed Augmentin for possible infection Maintain adequate fluid intake Warm salt water mouth rinse Ibuprofen as needed for pain Return to urgent care if symptoms worsen. Final Clinical Impressions(s) / UC Diagnoses   Final diagnoses:  Trigeminal neuralgia of right side of face     Discharge Instructions      Pain on the right side of your face is likely related to the dental procedure Completed a course of antibiotics If the pain is persistent you will need to be reevaluated again at some point Pain we will get better over the course of a few weeks or longer. Follow-up with your primary care physician   ED Prescriptions     Medication Sig Dispense Auth. Provider   amoxicillin-clavulanate (AUGMENTIN) 875-125 MG tablet Take 1  tablet by mouth every 12 (twelve) hours. 14 tablet Geralda Baumgardner, Myrene Galas, MD      PDMP not reviewed this encounter.   Chase Picket, MD 08/11/21 [phone]

## 2021-11-04 ENCOUNTER — Ambulatory Visit: Payer: No Typology Code available for payment source | Admitting: Internal Medicine

## 2021-11-04 ENCOUNTER — Other Ambulatory Visit: Payer: Self-pay

## 2021-11-04 VITALS — BP 170/116 | HR 83 | Temp 98.0°F | Ht 64.0 in | Wt 257.9 lb

## 2021-11-04 DIAGNOSIS — Z114 Encounter for screening for human immunodeficiency virus [HIV]: Secondary | ICD-10-CM

## 2021-11-04 DIAGNOSIS — Z13220 Encounter for screening for lipoid disorders: Secondary | ICD-10-CM

## 2021-11-04 DIAGNOSIS — E878 Other disorders of electrolyte and fluid balance, not elsewhere classified: Secondary | ICD-10-CM

## 2021-11-04 DIAGNOSIS — Z87898 Personal history of other specified conditions: Secondary | ICD-10-CM

## 2021-11-04 DIAGNOSIS — I10 Essential (primary) hypertension: Secondary | ICD-10-CM | POA: Insufficient documentation

## 2021-11-04 DIAGNOSIS — Z1211 Encounter for screening for malignant neoplasm of colon: Secondary | ICD-10-CM

## 2021-11-04 DIAGNOSIS — E785 Hyperlipidemia, unspecified: Secondary | ICD-10-CM

## 2021-11-04 DIAGNOSIS — R7303 Prediabetes: Secondary | ICD-10-CM

## 2021-11-04 DIAGNOSIS — D352 Benign neoplasm of pituitary gland: Secondary | ICD-10-CM | POA: Insufficient documentation

## 2021-11-04 DIAGNOSIS — Z1159 Encounter for screening for other viral diseases: Secondary | ICD-10-CM

## 2021-11-04 MED ORDER — AMLODIPINE BESYLATE 10 MG PO TABS: 10.0000 mg | ORAL_TABLET | Freq: Every day | ORAL | 11 refills | Status: AC

## 2021-11-04 MED ORDER — CARVEDILOL 6.25 MG PO TABS: 6.2500 mg | ORAL_TABLET | Freq: Two times a day (BID) | ORAL | 2 refills | Status: DC

## 2021-11-04 NOTE — Progress Notes (Signed)
? ?  CC: establish care with known hx of HTN, HLD, and pituitary macroadenoma  ? ?HPI: ? ?Patricia Harris is a 56 y.o. female with a PMHx stated below and presents today for stated above. Please see the Encounters tab for problem-based Assessment & Plan for additional details. ? ?Past Medical History:  ?Diagnosis Date  ? Cancer [facility])   ? LEFT NEPHRECTOMY   ? History of kidney cancer   ? LEFT  ? Hypertension   ? Pre-diabetes   ? ? ?Current Outpatient Medications on File Prior to Visit  ?Medication Sig Dispense Refill  ? amoxicillin-clavulanate (AUGMENTIN) 875-125 MG tablet Take 1 tablet by mouth every 12 (twelve) hours. 14 tablet 0  ? hydrochlorothiazide (HYDRODIURIL) 25 MG tablet Take 1 tablet (25 mg total) by mouth daily. 30 tablet 0  ? ibuprofen (ADVIL,MOTRIN) 200 MG tablet Take 400 mg by mouth every 6 (six) hours as needed for moderate pain.    ? losartan (COZAAR) 100 MG tablet Take 100 mg by mouth daily.    ? polyethylene glycol (MIRALAX / GLYCOLAX) packet Take 17 g by mouth daily as needed. 14 each 0  ? Pseudoephedrine-APAP-DM (DAYQUIL PO) Take 30 mLs by mouth daily as needed (cold symptoms).    ? ?No current facility-administered medications on file prior to visit.  ? ? ?Family History  ?Problem Relation Age of Onset  ? Breast cancer Paternal Grandmother   ? ? ?Social History  ? ?Socioeconomic History  ? Marital status: Divorced  ?  Spouse name: Not on file  ? Number of children: Not on file  ? Years of education: Not on file  ? Highest education level: Not on file  ?Occupational History  ? Not on file  ?Tobacco Use  ? Smoking status: Never  ? Smokeless tobacco: Never  ?Substance and Sexual Activity  ? Alcohol use: No  ? Drug use: No  ? Sexual activity: Never  ?  Birth control/protection: None  ?Other Topics Concern  ? Not on file  ?Social History Narrative  ? Not on file  ? ?Social Determinants of Health  ? ?Financial Resource Strain: Not on file  ?Food Insecurity: Not on file  ?Transportation Needs: Not  on file  ?Physical Activity: Not on file  ?Stress: Not on file  ?Social Connections: Not on file  ?Intimate Partner Violence: Not on file  ? ?All above PMHx, social hx, and family hx reviewed and verified with patient, and is current.  ? ?Review of Systems: ?ROS negative except for what is noted on the assessment and plan. ? ?Vitals:  ? 11/04/21 1532  ?BP: (!) 170/116  ?Pulse: 83  ?Temp: 98 ?F (36.7 ?C)  ?TempSrc: Oral  ?SpO2: 98%  ?Weight: 257 lb 14.4 oz (117 kg)  ?Height: '5\' 4"'$  (1.626 m)  ? ? ?Physical Exam: ?Constitutional: alert, well-appearing, in NAD ?HENT: normocephalic, atraumatic, mucous membranes moist ?Eyes: conjunctiva non-erythematous, EOMI ?Neck: no thyromegaly ?Cardiovascular: RRR, no m/r/g, non-edematous bilateral LE ?Pulmonary/Chest: normal work of breathing on RA, LCTAB ?Abdominal: soft, non-tender to palpation, non-distended ?MSK: normal bulk and tone  ?Neurological: A&O x 3, 5/5 strength in bilateral upper and lower extremities, follows commands  ?Skin: warm and dry  ?Psych: normal behavior, normal affect  ? ?Assessment & Plan:  ? ?See Encounters Tab for problem based charting. ? ?Patient discussed with Dr. Philipp Ovens ? ?Lajean Manes, MD  ?Internal Medicine Resident, PGY-1 ?[facility]  ?

## 2021-11-04 NOTE — Assessment & Plan Note (Signed)
Has been following with endocrinologist since incidental macroadenoma seen on MRI during June of 2022. Labs to date have suggested that pituitary function is normal and there is no sign of a functioning adenoma. She was instructed to follow up with ophthalmology for peripheral visual field testing, and it normal, reasonable to continue with clinical follow up. She was unable to f/u with ophthalmology d/t loss of insurance status, however now has insurance and is scheduled for appt for visual field testing in 2 weeks, with plan to follow up with her endocrinologist after that. She does complain of ongoing headaches which are unchanged in characteristic since diagnosis of pituitary macroadenoma; intractable headaches could be due to the mass and therefore should be considered when determining if she would benefit from surgery sooner, which was previously deferred to later time given she could be managed clinically. She plans to have discussion with her endocrinologist about medical management versus surgery, and encouraged her that if headaches continue to interact with QOL, it may be reasonable to proceed with surgery. We will also focus on better controlling her chronically elevated uncontrolled HTN prior to her visit to assist in differentiation whether headaches d/t HTN or adenoma, though likely multifactorial.  ? ?Go to ophthalmology appt  ?F/u with endocrinology  ?Work on controlling HTN ? ?

## 2021-11-04 NOTE — Assessment & Plan Note (Addendum)
LDL 161 one year ago. She was on Crestorfor primary prevention, but reports that it was for a short period of time and is no longer on it. Will repeat Lipid Panel today, but suspect we will need to re-start. Tolerated it well without adverse effects. Counseled on the importance of continued lifestyle modifications, including: weight loss, daily exercise, and healthy diet with limited processed and fatty foods.  ? ?F/u on Lipid panel and add Crestor 20 mg daily if LDL not at goal for primary prevention, LDL <100.  ?Encouraged to continue lifestyle modifications ? ?Addendum: ?LDL up to 213 form 161 one year ago. Will start high intensity statin, Crestor 20, mg at this time. ?

## 2021-11-04 NOTE — Assessment & Plan Note (Addendum)
HIV and Hep C screening done  ?Tetanus shot deferred ?Has already gotten Flu shot this season ?Colonoscopy referral placed  ?Diabetes screening via A1c done given weight and hx of HTN and HLD.  ? ?Addendum: ?HIV and Hep C screening negative ? ?

## 2021-11-04 NOTE — Patient Instructions (Addendum)
Blood pressure: ?Continue taking Losartan  ?Start taking amlodipine daily ?Start taking Coreg 6.25 twice daily for 1 week and then increase to 12.5 twice daily starting week 2.  ? ?I will call you with diabetes, cholesterol, kidney function, HIV, and Hep C results.  ? ?Colonoscopy referral has been placed.  ? ?Please go to your eye doctor appt and then follow up with your endocrinologist.  ?

## 2021-11-04 NOTE — Assessment & Plan Note (Addendum)
Hx of uncontrolled HTN on Losartan 100 mg daily and HCTZ 25 mg daily, with excellent medication compliance. Pt reports frustration surrounding uncontrolled HTN as she has been doing what she was told, without much improvement in pressures. She has been off of HCTZ per her prior PCP they noticed worsening headaches during periods of time when she was taking HCTZ-- HCTZ discontinued ~3 mo ago, and has only been taking Losartan for ~6-7 months now.  ?BP 170/116 and repeat 143/100 today. Reported home readings with SBP 160's and DBP 100's. BP not at goal (< 130/80). Reports good medication compliance, and tolerating Losartan without adverse effects. Denies new headaches, new vision changes, lightheadedness, chest pain, SHOB, leg swelling or changes in speech. Exam benign and vitals otherwise wnl. Counseled on the importance of daily exercise, low salt diet, and weight loss. She has been doing intermittent fasting for weight loss for about 1 month now. Will need to add 2 agents today to better control chronically elevated uncontrolled HTN.  ? ?Continue with Losartan 100 mg daily ?Start amlodipine 10 mg daily  ?Start Coreg 6.25 BID for 1 week and self up-titrate up to 12.5 BID daily.  ?Avoid HCTZ  ?BP log and bring to next visit  ?Continue lifestyle changes ?F/u on BMP ?F/u in 3 weeks-- can consider up-titrating Coreg or addition of Spiro if BP remains elevated.  ? ?Addendum: ?BMP with stable and normal renal function-- continue with Losartan.  ?

## 2021-11-05 DIAGNOSIS — R7303 Prediabetes: Secondary | ICD-10-CM | POA: Insufficient documentation

## 2021-11-05 LAB — HCV INTERPRETATION

## 2021-11-05 LAB — HEMOGLOBIN A1C
Est. average glucose Bld gHb Est-mCnc: 131 mg/dL
Hgb A1c MFr Bld: 6.2 % — ABNORMAL HIGH (ref 4.8–5.6)

## 2021-11-05 LAB — LIPID PANEL
Chol/HDL Ratio: 6.3 ratio — ABNORMAL HIGH (ref 0.0–4.4)
Cholesterol, Total: 289 mg/dL — ABNORMAL HIGH (ref 100–199)
HDL: 46 mg/dL (ref >39)
LDL Chol Calc (NIH): 213 mg/dL — ABNORMAL HIGH (ref 0–99)
Triglycerides: 160 mg/dL — ABNORMAL HIGH (ref 0–149)
VLDL Cholesterol Cal: 30 mg/dL (ref 5–40)

## 2021-11-05 LAB — HIV ANTIBODY (ROUTINE TESTING W REFLEX): HIV Screen 4th Generation wRfx: NONREACTIVE

## 2021-11-05 LAB — BMP8+ANION GAP
Anion Gap: 14 mmol/L (ref 10.0–18.0)
BUN/Creatinine Ratio: 22 (ref 9–23)
BUN: 20 mg/dL (ref 6–24)
CO2: 21 mmol/L (ref 20–29)
Calcium: 9 mg/dL (ref 8.7–10.2)
Chloride: 101 mmol/L (ref 96–106)
Creatinine, Ser: 0.92 mg/dL (ref 0.57–1.00)
Glucose: 92 mg/dL (ref 70–99)
Potassium: 4.6 mmol/L (ref 3.5–5.2)
Sodium: 136 mmol/L (ref 134–144)
eGFR: 74 mL/min/{1.73_m2} (ref >59)

## 2021-11-05 LAB — HCV AB W REFLEX TO QUANT PCR: HCV Ab: NONREACTIVE

## 2021-11-05 NOTE — Assessment & Plan Note (Signed)
A1c resulted in pre-diabetes range of 6.3. Called pt to discuss this. Counseled on the benefits of daily exercise, limiting processed foods and high sugar foods, and weight loss. Denies polydipsia, polyuria, weight loss, lethargy, blurry vision, and changes in sensation. Will proceed with lifestyle modifications at this time, and continue with discussion during follow up appointment in 3 weeks.  ? ?Continue lifestyle modifications ?Consider metformin therapy if lifestyle modifications fail and A1c remains elevated.  ? ?

## 2021-11-08 ENCOUNTER — Other Ambulatory Visit: Payer: Self-pay | Admitting: Internal Medicine

## 2021-11-08 DIAGNOSIS — E785 Hyperlipidemia, unspecified: Secondary | ICD-10-CM

## 2021-11-08 MED ORDER — ROSUVASTATIN CALCIUM 20 MG PO TABS: 20.0000 mg | ORAL_TABLET | Freq: Every day | ORAL | 6 refills | Status: AC

## 2021-11-12 NOTE — Progress Notes (Signed)
Internal Medicine Clinic Attending  Case discussed with Dr. Patel  At the time of the visit.  We reviewed the resident's history and exam and pertinent patient test results.  I agree with the assessment, diagnosis, and plan of care documented in the resident's note.  

## 2021-11-25 ENCOUNTER — Ambulatory Visit: Payer: No Typology Code available for payment source | Admitting: Internal Medicine

## 2021-11-25 ENCOUNTER — Encounter: Payer: Self-pay | Admitting: Internal Medicine

## 2021-11-25 ENCOUNTER — Other Ambulatory Visit: Payer: Self-pay

## 2021-11-25 DIAGNOSIS — Z0000 Encounter for general adult medical examination without abnormal findings: Secondary | ICD-10-CM

## 2021-11-25 DIAGNOSIS — R7303 Prediabetes: Secondary | ICD-10-CM

## 2021-11-25 DIAGNOSIS — I10 Essential (primary) hypertension: Secondary | ICD-10-CM

## 2021-11-25 DIAGNOSIS — Z23 Encounter for immunization: Secondary | ICD-10-CM

## 2021-11-25 NOTE — Progress Notes (Signed)
? ?  CC: hypertension follow up, prediabetes ? ?HPI: ? ?Ms.Patricia Harris is a 56 y.o. female with PMHx as stated below presenting for hypertension follow up and prediabetes. She does not have any acute concerns at this time. Please see problem based charting for complete assessment and plan. ? ?Past Medical History:  ?Diagnosis Date  ? Cancer [facility])   ? LEFT NEPHRECTOMY   ? History of kidney cancer   ? LEFT  ? Hypertension   ? Pre-diabetes   ? ?Review of Systems:  Negative except as stated in HPI ? ?Physical Exam: ? ?Vitals:  ? 11/25/21 1515  ?BP: 123/82  ?Pulse: 75  ?Temp: 98.4 ?F (36.9 ?C)  ?TempSrc: Oral  ?SpO2: 96%  ?Weight: 252 lb 14.4 oz (114.7 kg)  ?Height: '5\' 4"'$  (1.626 m)  ? ?Physical Exam  ?Constitutional: Appears well-developed and well-nourished. No distress.  ?Cardiovascular: Normal rate, regular rhythm, S1 and S2 present, no murmurs, rubs, gallops.  Distal pulses intact ?Respiratory: No respiratory distress, Lungs are clear to auscultation bilaterally. ?Musculoskeletal: Normal bulk and tone.   ?Neurological: Is alert and oriented x4, no apparent focal deficits noted. ?Skin: Warm and dry.  No rash, erythema, lesions noted. ?Psychiatric: Normal mood and affect.  ? ?Assessment & Plan:  ? ?See Encounters Tab for problem based charting. ? ?Patient discussed with Dr. Philipp Ovens ? ?

## 2021-11-25 NOTE — Patient Instructions (Addendum)
Patricia Harris, ? ?It was a pleasure seeing you in clinic. Today we discussed:  ? ?Blood pressure: ?Continue losartan and amlodipine. Continue to monitor your blood pressures at home.  ?Continue with intermittent fasting/weight loss. Follow up in 3 months ? ?Prediabetes:  ?Continue lifestyle modifications as discussed ? ?If you have any questions or concerns, please call our clinic at [phone] between 9am-5pm and after hours call [phone] and ask for the internal medicine resident on call. If you feel you are having a medical emergency please call 911.  ? ?Thank you, we look forward to helping you remain healthy! ? ? ?

## 2021-11-27 NOTE — Assessment & Plan Note (Addendum)
BP Readings from Last 3 Encounters:  ?11/25/21 123/82  ?11/04/21 (!) 170/116  ?08/11/21 (!) 173/119  ? ?Patient is presenting for follow up of her hypertension. Patient has been on losartan '100mg'$  daily. At her last visit, she was started on amlodipine '10mg'$  daily and carvedilol 6.'25mg'$  bid with instructions to uptitrate to 12.'5mg'$  bid. She has previously been intolerant of HCTZ due to headaches. Today, patient reports improved blood pressure control with home blood pressure readings ranging from systolic 196-222L and diastolic 79-89Q. She reports good medication compliance with her losartan and amlodipine. However, has not been taking carvedilol due to adverse side effects that she read about.  ?She denies any headaches, vision changes, lightheadedness/dizziness, chest pain, shortness of breath, weakness or lower extremity swelling.  ?Patient continues with intermittent fasting and has lost 5lbs since her last office visit.  ? ?Plan: ?Continue current regimen of losartan '100mg'$  daily and amlodipine '10mg'$  daily ?Anticipate that she may be able to decrease antihypertensive regimen if continues with weight loss ?Encouraged for continued weight loss and lifestyle modifications with low sodium diet ?Advised to avoid medications with decongestants and pseudoephedrine ?Follow up in 3 months  ?

## 2021-11-27 NOTE — Assessment & Plan Note (Signed)
Tdap shot at this visit ?Colonoscopy referral pending  ?

## 2021-11-27 NOTE — Assessment & Plan Note (Signed)
Most recent A1c 6.3. Patient discussed lifestyle modifications including daily aerobic exercise and limiting processed/high sugar foods. She is currently intermittent fasting and has lost 5lbs since her last office visit.  ? ?Plan: ?Encouraged for continued lifestyle modifications ?Repeat A1c in 6 months  ?

## 2021-11-30 NOTE — Progress Notes (Signed)
Internal Medicine Clinic Attending ° °Case discussed with Dr. Aslam  At the time of the visit.  We reviewed the resident’s history and exam and pertinent patient test results.  I agree with the assessment, diagnosis, and plan of care documented in the resident’s note.  °

## 2021-12-21 IMAGING — CT CT HEAD W/O CM
3 series · 16 of 47 positions shown, 19 images · non-contrast
Comparison: None.

CLINICAL DATA: Headache.  Classic migraine headache.

EXAM:
CT HEAD WITHOUT CONTRAST
TECHNIQUE: Contiguous axial images were obtained from the base of the skull
through the vertex without intravenous contrast.

[Series 2: head wo · axial · 0.45mm/px · z∈[-81,+54]mm · 10 of 33 slices shown, 13 images]
[im 3/33  brain]
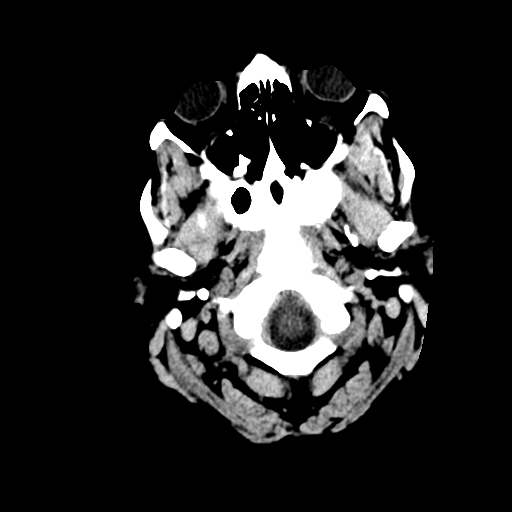
[im 3/33  bone]
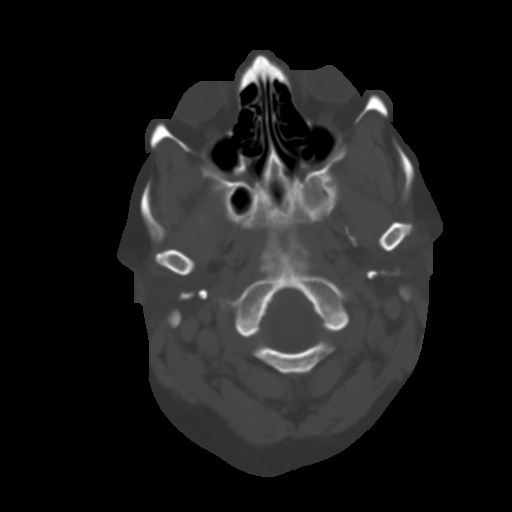
[im 6/33  brain]
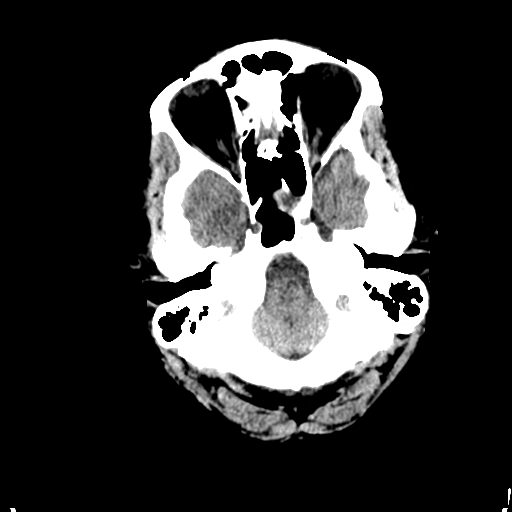
[im 9/33  brain]
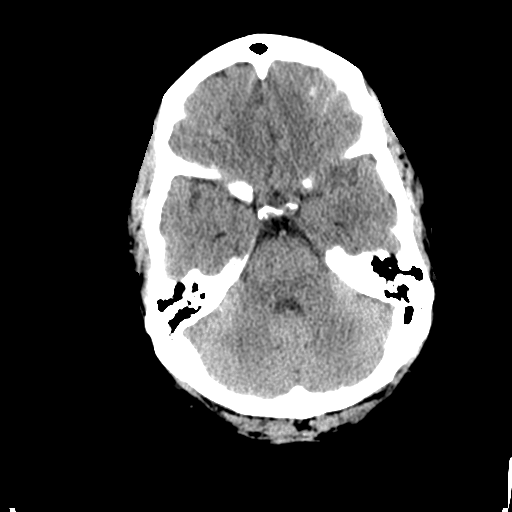
[im 12/33  brain]
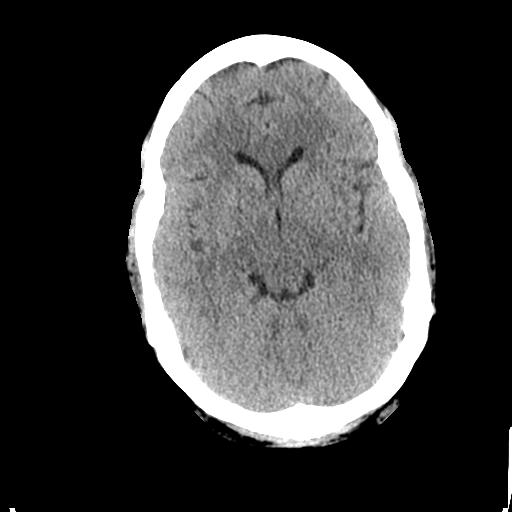
[im 15/33  brain]
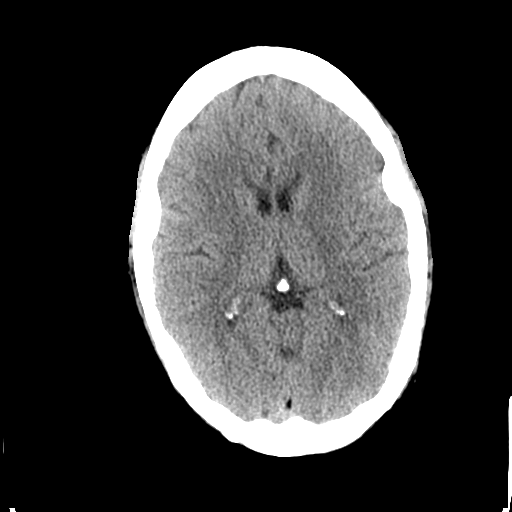
[im 15/33  bone]
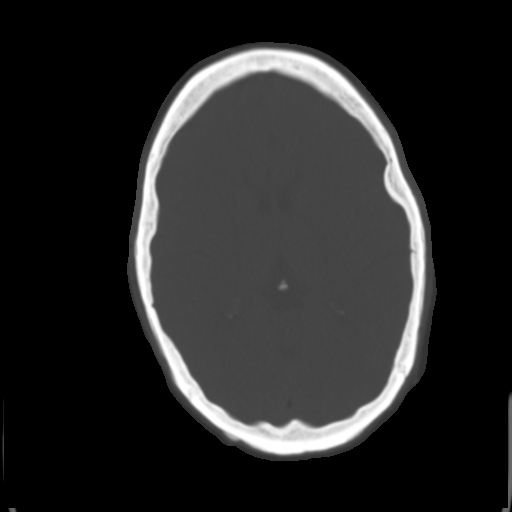
[im 18/33  brain]
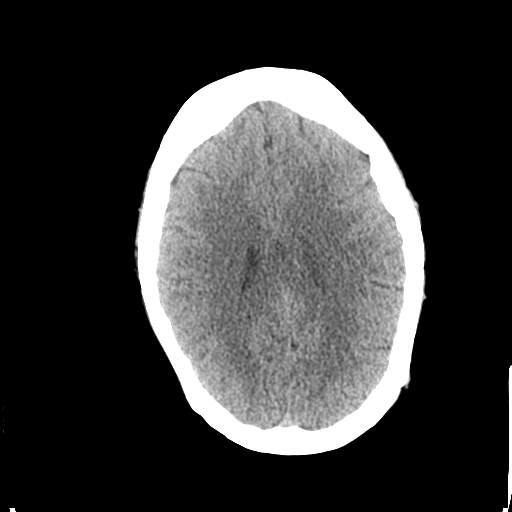
[im 21/33  brain]
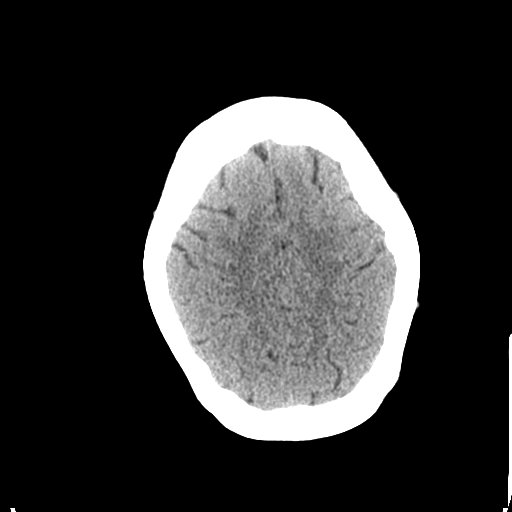
[im 25/33  brain]
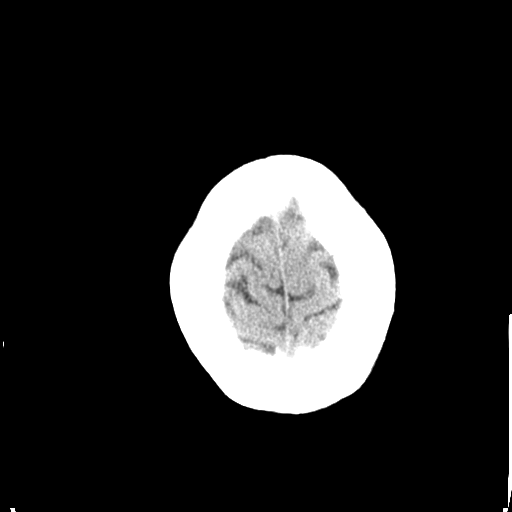
[im 27/33  brain]
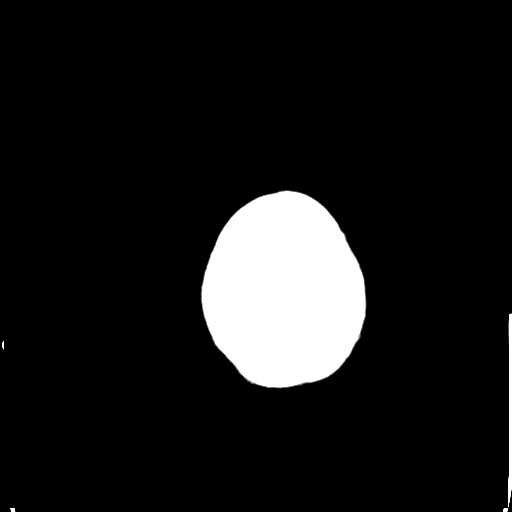
[im 27/33  bone]
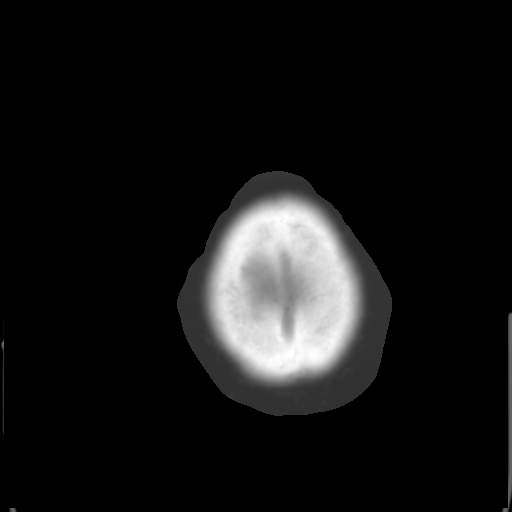
[im 30/33  brain]
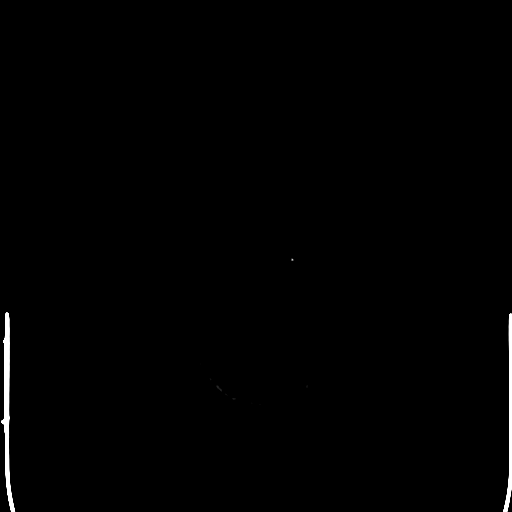

[Series 4: coronal soft tissue · coronal · 0.30mm/px · 3 of 68 slices shown]
[im 23/68  brain]
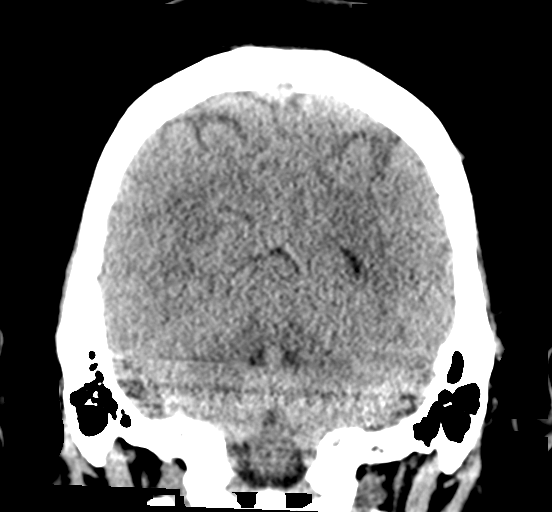
[im 30/68  brain]
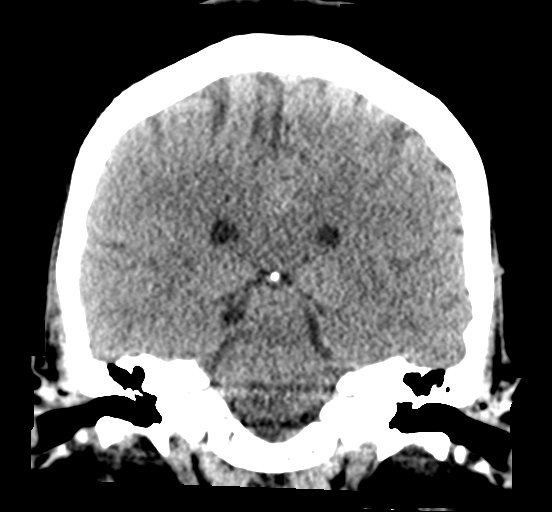
[im 38/68  brain]
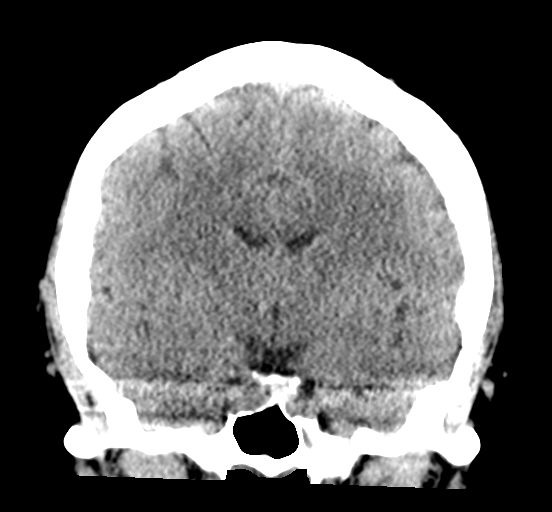

[Series 5: sagittal soft tissue · sagittal · 0.30mm/px · 3 of 55 slices shown]
[im 19/55  brain]
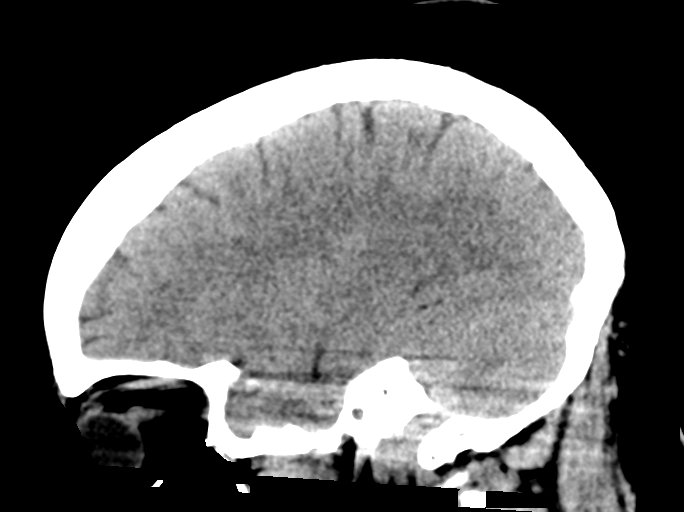
[im 28/55  brain]
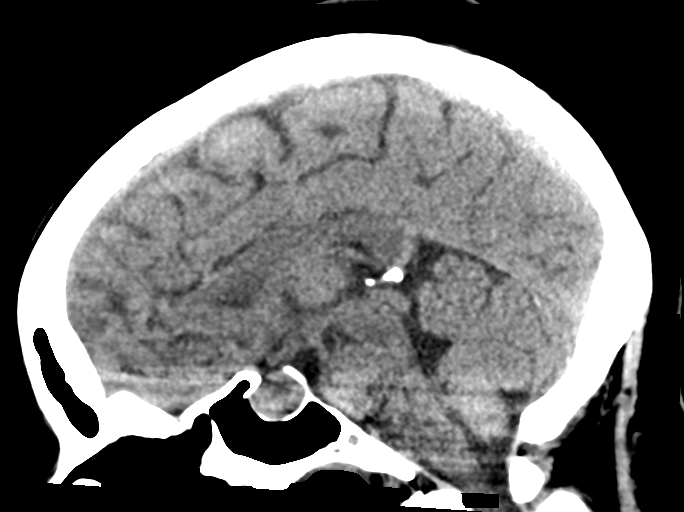
[im 37/55  brain]
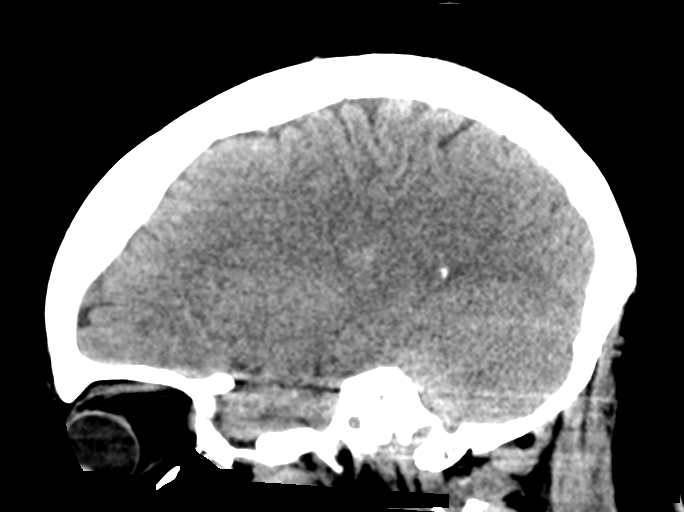

[16 of 47 positions shown; findings below may reference images not displayed]

FINDINGS: Brain: Ventricle size normal.  Negative for hemorrhage or infarct

Enlargement of the sella. Pituitary lesion fills the sella measuring
approximately 14 mm in size.

Vascular: Negative for hyperdense vessel

Skull: Negative

Sinuses/Orbits: Air-fluid level left sphenoid sinus.  Negative orbit

Other: None
IMPRESSION: Enlargement of the sella with pituitary lesion measuring 14 mm.
Recommend MRI of the brain without and with contrast for further
evaluation.

## 2022-01-26 ENCOUNTER — Other Ambulatory Visit: Payer: Self-pay | Admitting: Neurosurgery

## 2022-01-26 DIAGNOSIS — E236 Other disorders of pituitary gland: Secondary | ICD-10-CM

## 2022-01-30 ENCOUNTER — Other Ambulatory Visit: Payer: Self-pay | Admitting: Internal Medicine

## 2022-01-30 DIAGNOSIS — I10 Essential (primary) hypertension: Secondary | ICD-10-CM

## 2022-03-28 ENCOUNTER — Other Ambulatory Visit: Payer: Self-pay | Admitting: Internal Medicine

## 2022-03-28 DIAGNOSIS — I10 Essential (primary) hypertension: Secondary | ICD-10-CM

## 2022-11-19 ENCOUNTER — Other Ambulatory Visit: Payer: Self-pay

## 2022-11-19 ENCOUNTER — Emergency Department
Admission: EM | Admit: 2022-11-19 | Discharge: 2022-11-19 | Disposition: A | Discharge status: Home / Self Care | Payer: 59 | Attending: Emergency Medicine | Admitting: Emergency Medicine

## 2022-11-19 DIAGNOSIS — G8929 Other chronic pain: Secondary | ICD-10-CM | POA: Diagnosis not present

## 2022-11-19 DIAGNOSIS — M25561 Pain in right knee: Secondary | ICD-10-CM | POA: Diagnosis present

## 2022-11-19 DIAGNOSIS — M25562 Pain in left knee: Secondary | ICD-10-CM | POA: Diagnosis not present

## 2022-11-19 MED ORDER — KETOROLAC TROMETHAMINE 30 MG/ML IJ SOLN
30.0000 mg | Freq: Once | INTRAMUSCULAR | Status: AC
  Administered 2022-11-19: 30 mg via INTRAMUSCULAR
  Filled 2022-11-19: qty 1

## 2022-11-19 NOTE — ED Triage Notes (Signed)
Pt to ED via POV c/o bilateral knee pain. Pt fell 1 mo ago and hurt both knees. Pt has been getting steroids for it but doses stopped on Monday. Since Monday has been having worsening pain and swelling to both knees. NAD at this time, denies any other symtptoms

## 2022-11-19 NOTE — Discharge Instructions (Signed)
Use Tylenol for pain and fevers.  Up to 1000 mg per dose, up to 4 times per day.  Do not take more than 4000 mg of Tylenol/acetaminophen within 24 hours..  Use naproxen/Aleve for anti-inflammatory pain relief. Use up to 500mg every 12 hours. Do not take more frequently than this. Do not use other NSAIDs (ibuprofen, Advil) while taking this medication. It is safe to take Tylenol with this.   

## 2022-11-19 NOTE — ED Provider Notes (Signed)
   [facility] Provider Note    Event Date/Time   First MD Initiated Contact with Patient 11/19/22 0602     (approximate)   History   Knee Pain   HPI  Adrienna Soelberg is a 57 y.o. female who presents to the ED for evaluation of Knee Pain   I reviewed orthopedic consultation clinic visit from 3/19.  Bilateral knee pain after a fall in February.  Patient presents to the ED for evaluation of chronic bilateral knee pain.  No other injuries in the past 6 weeks beyond the initial fall.  Reports some improvement of swelling sensation and discomfort while on steroid taper, but reports worsening of pain again earlier this week after this finished.  She has not yet reached out to her orthopedic surgeon but she presents to the ED for evaluation of her chronic pain.   Physical Exam   Triage Vital Signs: ED Triage Vitals  Enc Vitals Group     BP 11/19/22 0559 (!) 162/113     Pulse Rate 11/19/22 0559 76     Resp 11/19/22 0559 16     Temp 11/19/22 0559 97.9 F (36.6 C)     Temp Source 11/19/22 0559 Oral     SpO2 11/19/22 0559 97 %     Weight --      Height --      Head Circumference --      Peak Flow --      Pain Score 11/19/22 0557 9     Pain Loc --      Pain Edu? --      Excl. in [place]? --     Most recent vital signs: Vitals:   11/19/22 0559  BP: (!) 162/113  Pulse: 76  Resp: 16  Temp: 97.9 F (36.6 C)  SpO2: 97%    General: Awake, no distress.  Ambulatory independently with normal gait.  Obese. CV:  Good peripheral perfusion.  Resp:  Normal effort.  Abd:  No distention.  MSK:  No deformity noted.  Neuro:  No focal deficits appreciated. Other:     ED Results / Procedures / Treatments   Labs (all labs ordered are listed, but only abnormal results are displayed) Labs Reviewed - No data to display  EKG   RADIOLOGY   Official radiology report(s): No results found.  PROCEDURES and INTERVENTIONS:  Procedures  Medications  ketorolac  (TORADOL) 30 MG/ML injection 30 mg (30 mg Intramuscular Given 11/19/22 0616)     IMPRESSION / MDM / ASSESSMENT AND PLAN / ED COURSE  I reviewed the triage vital signs and the nursing notes.  Differential diagnosis includes, but is not limited to, arthritis, symptomatic effusion, fracture, dislocation, gout, septic joint  57 year old woman presents with chronic bilateral knee pain suitable for outpatient management with orthopedic follow-up.  She looks systemically well.  No signs of trauma, effusions or impaired range of motion.  No indications for repeat imaging.  We discussed following back up with orthopedic surgeon as well as appropriate return precautions for the ED.     FINAL CLINICAL IMPRESSION(S) / ED DIAGNOSES   Final diagnoses:  Chronic pain of both knees     Rx / DC Orders   ED Discharge Orders     None        Note:  This document was prepared using Dragon voice recognition software and may include unintentional dictation errors.   Vladimir Crofts, MD 11/19/22 [phone]

## 2023-04-09 ENCOUNTER — Emergency Department: Payer: 59

## 2023-04-09 ENCOUNTER — Encounter: Payer: Self-pay | Admitting: *Deleted

## 2023-04-09 ENCOUNTER — Emergency Department
Admission: EM | Admit: 2023-04-09 | Discharge: 2023-04-09 | Disposition: A | Discharge status: Home / Self Care | Payer: 59 | Attending: Emergency Medicine | Admitting: Emergency Medicine

## 2023-04-09 ENCOUNTER — Other Ambulatory Visit: Payer: Self-pay

## 2023-04-09 DIAGNOSIS — R1033 Periumbilical pain: Secondary | ICD-10-CM | POA: Diagnosis present

## 2023-04-09 DIAGNOSIS — I10 Essential (primary) hypertension: Secondary | ICD-10-CM | POA: Insufficient documentation

## 2023-04-09 LAB — COMPREHENSIVE METABOLIC PANEL
ALT: 12 U/L (ref 0–44)
AST: 13 U/L — ABNORMAL LOW (ref 15–41)
Albumin: 3.6 g/dL (ref 3.5–5.0)
Alkaline Phosphatase: 119 U/L (ref 38–126)
Anion gap: 8 (ref 5–15)
BUN: 21 mg/dL — ABNORMAL HIGH (ref 6–20)
CO2: 24 mmol/L (ref 22–32)
Calcium: 8.8 mg/dL — ABNORMAL LOW (ref 8.9–10.3)
Chloride: 102 mmol/L (ref 98–111)
Creatinine, Ser: 1.03 mg/dL — ABNORMAL HIGH (ref 0.44–1.00)
GFR, Estimated: >60 mL/min (ref >60)
Glucose, Bld: 111 mg/dL — ABNORMAL HIGH (ref 70–99)
Potassium: 3.7 mmol/L (ref 3.5–5.1)
Sodium: 134 mmol/L — ABNORMAL LOW (ref 135–145)
Total Bilirubin: 0.6 mg/dL (ref 0.3–1.2)
Total Protein: 8.2 g/dL — ABNORMAL HIGH (ref 6.5–8.1)

## 2023-04-09 LAB — URINALYSIS, ROUTINE W REFLEX MICROSCOPIC
Bilirubin Urine: NEGATIVE
Glucose, UA: NEGATIVE mg/dL
Ketones, ur: NEGATIVE mg/dL
Nitrite: NEGATIVE
Protein, ur: NEGATIVE mg/dL
Specific Gravity, Urine: 1.012 (ref 1.005–1.030)
pH: 6 (ref 5.0–8.0)

## 2023-04-09 LAB — CBC
HCT: 35.6 % — ABNORMAL LOW (ref 36.0–46.0)
Hemoglobin: 11.8 g/dL — ABNORMAL LOW (ref 12.0–15.0)
MCH: 30.1 pg (ref 26.0–34.0)
MCHC: 33.1 g/dL (ref 30.0–36.0)
MCV: 90.8 fL (ref 80.0–100.0)
Platelets: 357 10*3/uL (ref 150–400)
RBC: 3.92 MIL/uL (ref 3.87–5.11)
RDW: 13.1 % (ref 11.5–15.5)
WBC: 12.7 10*3/uL — ABNORMAL HIGH (ref 4.0–10.5)
nRBC: 0 % (ref 0.0–0.2)

## 2023-04-09 LAB — LIPASE, BLOOD: Lipase: 37 U/L (ref 11–51)

## 2023-04-09 MED ORDER — IOHEXOL 300 MG/ML  SOLN
100.0000 mL | Freq: Once | INTRAMUSCULAR | Status: AC | PRN
  Administered 2023-04-09: 100 mL via INTRAVENOUS

## 2023-04-09 NOTE — ED Triage Notes (Signed)
Pt reports "radiating" pain around the umbilicus for about a month. Reports she had umbilical hernia surgery previously and it feels similar. LBM yesterday, normal. No issues with n/v or fevers.

## 2023-04-09 NOTE — ED Provider Notes (Signed)
CT ABDOMEN PELVIS W CONTRAST  Result Date: 04/09/2023 CLINICAL DATA:  Suspect bowel obstruction generalized abdominal pain with palpable hernia. Pain around umbilicus 1 month. Previous umbilical hernia repair. History of renal cell carcinoma. EXAM: CT ABDOMEN AND PELVIS WITH CONTRAST TECHNIQUE: Multidetector CT imaging of the abdomen and pelvis was performed using the standard protocol following bolus administration of intravenous contrast. RADIATION DOSE REDUCTION: This exam was performed according to the departmental dose-optimization program which includes automated exposure control, adjustment of the mA and/or kV according to patient size and/or use of iterative reconstruction technique. CONTRAST:  OMNIPAQUE IOHEXOL 300 MG/ML  SOLN COMPARISON:  07/03/2016 FINDINGS: Lower chest: Minimal linear scarring left lower lobe. Mild cardiomegaly. Hepatobiliary: Gallbladder, liver and biliary tree are normal. Pancreas: Normal. Spleen: Normal. Adrenals/Urinary Tract: Adrenal glands are normal. Evidence of previous left nephrectomy. Right kidney is normal in size without hydronephrosis or nephrolithiasis. 2.7 cm right renal homogeneous low density lesion with Hounsfield unit measurements 12 compatible with a cyst. Right ureter is normal. Bladder is unremarkable. Stomach/Bowel: Surgical suture line over the stomach and distal esophageal region. 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 Small bowel is otherwise unremarkable. Previous appendectomy. Minimal diverticulosis of the colon. Cecum located over the anterior mid to upper abdomen just right of midline. Vascular/Lymphatic: Abdominal aorta is normal in caliber. Remaining vascular structures are unremarkable. No adenopathy. Reproductive: Possible small uterine fibroids, otherwise uterus is unremarkable. Ovaries not visualized. Other: Persistent umbilical hernia is present smaller compared to the prior exam with fascial defect measuring 2.4 cm in with. The small  hernia sac contains only peritoneal fat. There is no evidence of free fluid or focal inflammatory change. Musculoskeletal: No focal abnormality. IMPRESSION: 1. No acute findings in the abdomen/pelvis. 2. Persistent umbilical hernia smaller compared to the prior exam containing only peritoneal fat with fascial defect measuring 2.4 cm in width. 3. Evidence of previous left nephrectomy. 2.7 cm right renal cyst. 4. Minimal diverticulosis of the colon. 5. Possible small uterine fibroids. Electronically Signed   By: Elberta Fortis M.D.   On: 04/09/2023 08:15     I called the patient, discussed with her on the phone her results.  She will follow-up with both Dr. Laural Benes regarding her high blood pressure as well as her presentation today as well as her general surgeon.  We did also discuss that her blood pressure was noted be quite high but she has no symptoms of hypertensive urgency, and given she is here at the ER unclear if this is a spuriously elevated blood pressure represents true persistent significant hypertension.  Nonetheless she will be following with primary care as well as her general surgeon.  Reviewed careful return precautions regarding signs and symptoms of abdominal pain or worsening of the hernia.  Also discussed with her incidental finding of fibroids.  Patient comfortable plan for discharge  Return precautions and treatment recommendations and follow-up discussed with the patient who is agreeable with the plan.    Sharyn Creamer, MD 04/09/23 [phone]

## 2023-04-09 NOTE — Discharge Instructions (Signed)
? ?  Please return to the emergency room right away if you are to develop a fever, severe nausea, your pain becomes severe or worsens, you are unable to keep food down, begin vomiting any dark or bloody fluid, you develop any dark or bloody stools, feel dehydrated, or other new concerns or symptoms arise. ? ?

## 2023-04-09 NOTE — ED Provider Notes (Signed)
[facility] Provider Note    Event Date/Time   First MD Initiated Contact with Patient 04/09/23 [phone]     (approximate)   History   Abdominal Pain   HPI Patricia Harris is a 57 y.o. female who presents for evaluation of periumbilical pain.  She reports that she has had an umbilical hernia repair in the past.  Recently she has been noticing a firm area above the umbilicus.  Sometimes it hurts particularly when she changes positions or strains.  It was hurting more tonight so she thought she should get it checked out.  She has had no nausea or vomiting and no bowel habit changes.  No chest pain or shortness of breath.     Physical Exam   Triage Vital Signs: ED Triage Vitals  Encounter Vitals Group     BP 04/09/23 0118 (!) 199/112     Systolic BP Percentile --      Diastolic BP Percentile --      Pulse Rate 04/09/23 0118 80     Resp 04/09/23 0118 16     Temp 04/09/23 0118 98.4 F (36.9 C)     Temp Source 04/09/23 0118 Oral     SpO2 --      Weight 04/09/23 0116 117.9 kg (260 lb)     Height 04/09/23 0116 1.626 m (5\' 4" )     Head Circumference --      Peak Flow --      Pain Score 04/09/23 0116 8     Pain Loc --      Pain Education --      Exclude from Growth Chart --     Most recent vital signs: Vitals:   04/09/23 0118  BP: (!) 199/112  Pulse: 80  Resp: 16  Temp: 98.4 F (36.9 C)    General: Awake, no distress.  CV:  Good peripheral perfusion.  Resp:  Normal effort. Speaking easily and comfortably, no accessory muscle usage nor intercostal retractions.   Abd:  Obese.  Area of induration just superior to the umbilicus that is tender to palpation.  No discoloration.  It feels consistent with a hernia but seems to reduce with pressure but still is somewhat firm.   ED Results / Procedures / Treatments   Labs (all labs ordered are listed, but only abnormal results are displayed) Labs Reviewed  COMPREHENSIVE METABOLIC PANEL - Abnormal;  Notable for the following components:      Result Value   Sodium 134 (*)    Glucose, Bld 111 (*)    BUN 21 (*)    Creatinine, Ser 1.03 (*)    Calcium 8.8 (*)    Total Protein 8.2 (*)    AST 13 (*)    All other components within normal limits  CBC - Abnormal; Notable for the following components:   WBC 12.7 (*)    Hemoglobin 11.8 (*)    HCT 35.6 (*)    All other components within normal limits  URINALYSIS, ROUTINE W REFLEX MICROSCOPIC - Abnormal; Notable for the following components:   Color, Urine STRAW (*)    APPearance CLEAR (*)    Hgb urine dipstick SMALL (*)    Leukocytes,Ua SMALL (*)    Bacteria, UA RARE (*)    All other components within normal limits  LIPASE, BLOOD     RADIOLOGY CT of the abdomen and pelvis is pending at the time of transfer of ED care to Dr. Fanny Bien.   PROCEDURES:  Critical Care performed: No  Procedures    IMPRESSION / MDM / ASSESSMENT AND PLAN / ED COURSE  I reviewed the triage vital signs and the nursing notes.                              Differential diagnosis includes, but is not limited to, umbilical hernia, mesh failure, SBO/ileus or partial SBO.  Patient's presentation is most consistent with acute presentation with potential threat to life or bodily function.  Labs/studies ordered: CMP, lipase, urinalysis, CBC, CT abdomen/pelvis  Interventions/Medications given:  Medications - No data to display  (Note:  hospital course my include additional interventions and/or labs/studies not listed above.)   Vital signs are notable for hypertension but this is likely not contributing in any way to the current presentation.  Her abdomen is only tender at the probable umbilical hernia at the site of her prior surgery, just superior to the umbilicus.  I have a low suspicion for SBO or even partial SBO, but given the tenderness to palpation, the prior surgery, and the question of incarcerated umbilical hernia in the setting of prior mesh, I will  evaluate further with a CT of the abdomen and pelvis.  Patient agrees with the plan and does not need analgesia at this time.  Clinical Course as of 04/09/23 0715  Wynelle Link Apr 09, 2023  4401 Transferred ED care to Dr. Fanny Bien to follow-up on CT scan.  Anticipate discharge if no significant abnormalities are identified on the CT requiring emergent intervention. [CF]    Clinical Course User Index [CF] Loleta Rose, MD     FINAL CLINICAL IMPRESSION(S) / ED DIAGNOSES   Final diagnoses:  Periumbilical abdominal pain     Rx / DC Orders   ED Discharge Orders     None        Note:  This document was prepared using Dragon voice recognition software and may include unintentional dictation errors.   Loleta Rose, MD 04/09/23 [phone]

## 2023-04-09 NOTE — ED Provider Notes (Signed)
Plan: Follow-up on CT abd/plv, if no acute concerning findings, anticipate discharge to home to follow-up with her MD   Sharyn Creamer, MD 04/09/23 [phone]

## 2023-09-02 ENCOUNTER — Emergency Department
Admission: EM | Admit: 2023-09-02 | Discharge: 2023-09-02 | Disposition: A | Discharge status: Home / Self Care | Payer: 59 | Attending: Emergency Medicine | Admitting: Emergency Medicine

## 2023-09-02 ENCOUNTER — Other Ambulatory Visit: Payer: Self-pay

## 2023-09-02 DIAGNOSIS — R112 Nausea with vomiting, unspecified: Secondary | ICD-10-CM | POA: Insufficient documentation

## 2023-09-02 DIAGNOSIS — I10 Essential (primary) hypertension: Secondary | ICD-10-CM | POA: Diagnosis not present

## 2023-09-02 DIAGNOSIS — R6883 Chills (without fever): Secondary | ICD-10-CM | POA: Diagnosis not present

## 2023-09-02 DIAGNOSIS — R197 Diarrhea, unspecified: Secondary | ICD-10-CM | POA: Diagnosis not present

## 2023-09-02 DIAGNOSIS — R1084 Generalized abdominal pain: Secondary | ICD-10-CM | POA: Insufficient documentation

## 2023-09-02 DIAGNOSIS — R42 Dizziness and giddiness: Secondary | ICD-10-CM | POA: Insufficient documentation

## 2023-09-02 LAB — CBC
HCT: 37.7 % (ref 36.0–46.0)
Hemoglobin: 12.1 g/dL (ref 12.0–15.0)
MCH: 30.6 pg (ref 26.0–34.0)
MCHC: 32.1 g/dL (ref 30.0–36.0)
MCV: 95.2 fL (ref 80.0–100.0)
Platelets: 352 10*3/uL (ref 150–400)
RBC: 3.96 MIL/uL (ref 3.87–5.11)
RDW: 13.1 % (ref 11.5–15.5)
WBC: 20.2 10*3/uL — ABNORMAL HIGH (ref 4.0–10.5)
nRBC: 0 % (ref 0.0–0.2)

## 2023-09-02 LAB — URINALYSIS, ROUTINE W REFLEX MICROSCOPIC
Bilirubin Urine: NEGATIVE
Glucose, UA: NEGATIVE mg/dL
Hgb urine dipstick: NEGATIVE
Ketones, ur: NEGATIVE mg/dL
Leukocytes,Ua: NEGATIVE
Nitrite: NEGATIVE
Protein, ur: NEGATIVE mg/dL
Specific Gravity, Urine: 1.011 (ref 1.005–1.030)
pH: 7 (ref 5.0–8.0)

## 2023-09-02 LAB — COMPREHENSIVE METABOLIC PANEL
ALT: 60 U/L — ABNORMAL HIGH (ref 0–44)
AST: 99 U/L — ABNORMAL HIGH (ref 15–41)
Albumin: 3.4 g/dL — ABNORMAL LOW (ref 3.5–5.0)
Alkaline Phosphatase: 111 U/L (ref 38–126)
Anion gap: 11 (ref 5–15)
BUN: 14 mg/dL (ref 6–20)
CO2: 25 mmol/L (ref 22–32)
Calcium: 8.2 mg/dL — ABNORMAL LOW (ref 8.9–10.3)
Chloride: 97 mmol/L — ABNORMAL LOW (ref 98–111)
Creatinine, Ser: 0.91 mg/dL (ref 0.44–1.00)
GFR, Estimated: >60 mL/min (ref >60)
Glucose, Bld: 100 mg/dL — ABNORMAL HIGH (ref 70–99)
Potassium: 3.9 mmol/L (ref 3.5–5.1)
Sodium: 133 mmol/L — ABNORMAL LOW (ref 135–145)
Total Bilirubin: 1.1 mg/dL (ref 0.0–1.2)
Total Protein: 7.6 g/dL (ref 6.5–8.1)

## 2023-09-02 LAB — LIPASE, BLOOD: Lipase: 27 U/L (ref 11–51)

## 2023-09-02 MED ORDER — ONDANSETRON 8 MG PO TBDP
8.0000 mg | ORAL_TABLET | Freq: Once | ORAL | Status: AC
  Administered 2023-09-02: 8 mg via ORAL
  Filled 2023-09-02: qty 1

## 2023-09-02 MED ORDER — ALUM & MAG HYDROXIDE-SIMETH 200-200-20 MG/5ML PO SUSP
30.0000 mL | Freq: Once | ORAL | Status: AC
  Administered 2023-09-02: 30 mL via ORAL
  Filled 2023-09-02: qty 30

## 2023-09-02 MED ORDER — ONDANSETRON 4 MG PO TBDP: 4.0000 mg | ORAL_TABLET | Freq: Three times a day (TID) | ORAL | 0 refills | Status: AC | PRN

## 2023-09-02 MED ORDER — FAMOTIDINE 20 MG PO TABS: 20.0000 mg | ORAL_TABLET | Freq: Two times a day (BID) | ORAL | 0 refills | Status: AC

## 2023-09-02 MED ORDER — FAMOTIDINE 20 MG PO TABS
40.0000 mg | ORAL_TABLET | Freq: Once | ORAL | Status: AC
  Administered 2023-09-02: 40 mg via ORAL
  Filled 2023-09-02: qty 2

## 2023-09-02 NOTE — ED Triage Notes (Signed)
 Pt to ED via POV from home. Pt reports bilateral abd pain with radiation to lower back. Pt with hx of hernia repair and reports was told the mesh was coming up . Pt also reports N/V/D

## 2023-09-02 NOTE — ED Provider Triage Note (Signed)
 Emergency Medicine Provider Triage Evaluation Note  Patricia Harris , a 58 y.o. female  was evaluated in triage.  Pt complains of upper mid abdominal pain that wraps around to her back. Symptoms started this morning. She also feels nauseated. No fever. History of abdominal hernia repair.  Physical Exam  LMP  (LMP Unknown)  Gen:   Awake, no distress   Resp:  Normal effort  MSK:   Moves extremities without difficulty  Other:  Abdomen is soft  Medical Decision Making  Medically screening exam initiated at 1:26 PM.  Appropriate orders placed.  Hanni Milford Scaturro was informed that the remainder of the evaluation will be completed by another provider, this initial triage assessment does not replace that evaluation, and the importance of remaining in the ED until their evaluation is complete.  Abdominal pain protocol started.    Herlinda Kirk NOVAK, FNP 09/02/23 1511

## 2023-09-02 NOTE — ED Provider Notes (Signed)
 [facility] Provider Note    Event Date/Time   First MD Initiated Contact with Patient 09/02/23 1532     (approximate)   History   Chief Complaint: Abdominal Pain   HPI  Patricia Harris is a 58 y.o. female with a past history of hypertension and umbilical hernia who comes ED complaining of generalized abdominal pain which radiates around to her lower back.  Also associated nausea vomiting diarrhea over the last 2 days.  Endorses some chills and dizziness with standing.  No chest pain or shortness of breath or cough.  No black or bloody stool or emesis.  Patient also notes that she has been on prednisone for the past 1 to 2 weeks under care of her physician due to a nonspecific rash.  Denies any bruising  Outside records reviewed noting patient had CT abdomen pelvis on April 09, 2023 which was unremarkable except for 2.4 cm umbilical hernia, prior left nephrectomy.      Physical Exam   Triage Vital Signs: ED Triage Vitals  Encounter Vitals Group     BP 09/02/23 1330 (!) 160/98     Systolic BP Percentile --      Diastolic BP Percentile --      Pulse Rate 09/02/23 1327 73     Resp 09/02/23 1327 20     Temp 09/02/23 1327 98 F (36.7 C)     Temp src --      SpO2 09/02/23 1327 98 %     Weight --      Height --      Head Circumference --      Peak Flow --      Pain Score 09/02/23 1329 10     Pain Loc --      Pain Education --      Exclude from Growth Chart --     Most recent vital signs: Vitals:   09/02/23 1327 09/02/23 1330  BP:  (!) 160/98  Pulse: 73   Resp: 20   Temp: 98 F (36.7 C)   SpO2: 98%     General: Awake, no distress.  CV:  Good peripheral perfusion.  Regular rate rhythm Resp:  Normal effort.  Clear to auscultation bilaterally Abd:  No distention.  Soft with mild generalized tenderness, nonfocal.  Small umbilical hernia containing minimal tissue, not incarcerated.  Nontender Other:  No lower extremity edema   ED  Results / Procedures / Treatments   Labs (all labs ordered are listed, but only abnormal results are displayed) Labs Reviewed  COMPREHENSIVE METABOLIC PANEL - Abnormal; Notable for the following components:      Result Value   Sodium 133 (*)    Chloride 97 (*)    Glucose, Bld 100 (*)    Calcium  8.2 (*)    Albumin 3.4 (*)    AST 99 (*)    ALT 60 (*)    All other components within normal limits  CBC - Abnormal; Notable for the following components:   WBC 20.2 (*)    All other components within normal limits  URINALYSIS, ROUTINE W REFLEX MICROSCOPIC - Abnormal; Notable for the following components:   Color, Urine YELLOW (*)    APPearance HAZY (*)    All other components within normal limits  LIPASE, BLOOD     EKG    RADIOLOGY    PROCEDURES:  Procedures   MEDICATIONS ORDERED IN ED: Medications  ondansetron  (ZOFRAN -ODT) disintegrating tablet 8 mg (8 mg Oral  Given 09/02/23 1625)  alum & mag hydroxide-simeth (MAALOX/MYLANTA) 200-200-20 MG/5ML suspension 30 mL (30 mLs Oral Given 09/02/23 1625)  famotidine  (PEPCID ) tablet 40 mg (40 mg Oral Given 09/02/23 1625)     IMPRESSION / MDM / ASSESSMENT AND PLAN / ED COURSE  I reviewed the triage vital signs and the nursing notes.  DDx: Dehydration, AKI, electrolyte derangement, UTI, pancreatitis, viral gastroenteritis  Patient's presentation is most consistent with acute presentation with potential threat to life or bodily function.  Patient presents with abdominal pain nausea vomiting diarrhea.  Vital signs and exam are reassuring.  Serum labs unremarkable.  She has a leukocytosis which I think is due to prednisone response.  She is nontoxic, not septic.  Urinalysis is normal.  Feeling better after antacids and Zofran .  Will p.o. trial.   ----------------------------------------- 6:44 PM on 09/02/2023 ----------------------------------------- Tolerating crackers and water.  Repeat abdominal exam remains nonfocal, reassuring.   Extremities nontoxic, comfortable with outpatient follow-up. Considering the patient's symptoms, medical history, and physical examination today, I have low suspicion for cholecystitis or biliary pathology, pancreatitis, perforation or bowel obstruction, hernia, intra-abdominal abscess, AAA or dissection, volvulus or intussusception, mesenteric ischemia, or appendicitis.       FINAL CLINICAL IMPRESSION(S) / ED DIAGNOSES   Final diagnoses:  Generalized abdominal pain     Rx / DC Orders   ED Discharge Orders          Ordered    ondansetron  (ZOFRAN -ODT) 4 MG disintegrating tablet  Every 8 hours PRN        09/02/23 1844    famotidine  (PEPCID ) 20 MG tablet  2 times daily        09/02/23 1844             Note:  This document was prepared using Dragon voice recognition software and may include unintentional dictation errors.   Viviann Pastor, MD 09/02/23 [phone]
